# Patient Record
Sex: Female | Born: 1950 | Race: Black or African American | Hispanic: No | Marital: Single | Smoking: Never smoker
Health system: Southern US, Community
[De-identification: ages and names within clinical notes are randomized; demographics above are authoritative.]

## PROBLEM LIST (undated history)

## (undated) DIAGNOSIS — N189 Chronic kidney disease, unspecified: Secondary | ICD-10-CM

## (undated) DIAGNOSIS — E119 Type 2 diabetes mellitus without complications: Secondary | ICD-10-CM

## (undated) DIAGNOSIS — I10 Essential (primary) hypertension: Secondary | ICD-10-CM

## (undated) DIAGNOSIS — D649 Anemia, unspecified: Secondary | ICD-10-CM

## (undated) HISTORY — DX: Chronic kidney disease, unspecified: N18.9

---

## 2000-11-30 ENCOUNTER — Ambulatory Visit (HOSPITAL_COMMUNITY): Admission: RE | Admit: 2000-11-30 | Discharge: 2000-11-30 | Payer: Self-pay | Admitting: *Deleted

## 2000-11-30 ENCOUNTER — Encounter: Payer: Self-pay | Admitting: *Deleted

## 2001-12-10 ENCOUNTER — Encounter: Payer: Self-pay | Admitting: *Deleted

## 2001-12-10 ENCOUNTER — Ambulatory Visit (HOSPITAL_COMMUNITY): Admission: RE | Admit: 2001-12-10 | Discharge: 2001-12-10 | Payer: Self-pay | Admitting: *Deleted

## 2004-01-29 ENCOUNTER — Ambulatory Visit (HOSPITAL_COMMUNITY): Admission: RE | Admit: 2004-01-29 | Discharge: 2004-01-29 | Payer: Self-pay | Admitting: Family Medicine

## 2004-02-22 ENCOUNTER — Ambulatory Visit (HOSPITAL_COMMUNITY): Admission: RE | Admit: 2004-02-22 | Discharge: 2004-02-22 | Payer: Self-pay | Admitting: General Surgery

## 2005-02-10 ENCOUNTER — Ambulatory Visit (HOSPITAL_COMMUNITY): Admission: RE | Admit: 2005-02-10 | Discharge: 2005-02-10 | Payer: Self-pay | Admitting: Family Medicine

## 2005-03-05 ENCOUNTER — Ambulatory Visit (HOSPITAL_COMMUNITY): Admission: RE | Admit: 2005-03-05 | Discharge: 2005-03-05 | Payer: Self-pay | Admitting: Family Medicine

## 2005-11-26 ENCOUNTER — Ambulatory Visit (HOSPITAL_COMMUNITY): Admission: RE | Admit: 2005-11-26 | Discharge: 2005-11-26 | Payer: Self-pay | Admitting: Family Medicine

## 2006-03-02 ENCOUNTER — Ambulatory Visit (HOSPITAL_COMMUNITY): Admission: RE | Admit: 2006-03-02 | Discharge: 2006-03-02 | Payer: Self-pay | Admitting: Family Medicine

## 2006-05-31 ENCOUNTER — Emergency Department (HOSPITAL_COMMUNITY): Admission: EM | Admit: 2006-05-31 | Discharge: 2006-05-31 | Payer: Self-pay | Admitting: Emergency Medicine

## 2008-07-15 ENCOUNTER — Emergency Department (HOSPITAL_COMMUNITY): Admission: EM | Admit: 2008-07-15 | Discharge: 2008-07-15 | Payer: Self-pay | Admitting: Emergency Medicine

## 2010-03-29 ENCOUNTER — Emergency Department (HOSPITAL_COMMUNITY)
Admission: EM | Admit: 2010-03-29 | Discharge: 2010-03-29 | Payer: Self-pay | Source: Home / Self Care | Admitting: Emergency Medicine

## 2010-08-23 NOTE — H&P (Signed)
Stacey Mcneil, Stacey Mcneil               ACCOUNT NO.:  [id number]   MEDICAL RECORD NO.:  [id number]          PATIENT TYPE:  AMB   LOCATION:  DAY                           FACILITY:  [facility]   PHYSICIAN:  Leroy C. Katrinka Blazing, M.D.   DATE OF BIRTH:  [birth year]   DATE OF ADMISSION:  DATE OF DISCHARGE:  LH                                HISTORY & PHYSICAL   HISTORY OF PRESENT ILLNESS:  A 60 year old female with a history of guaiac-  positive stool, routine physical.  She has not had change in bowel habits.  She has two or three bowel movements per day.  She has no constipation or  diarrhea.  There is no history of hemorrhoid disease.  The patient is  scheduled for screening colonoscopy.   PAST MEDICAL HISTORY:  Positive for obesity, gastroesophageal reflux  disease.   MEDICATIONS:  1.  Hydrochlorothiazide.  2.  Prevacid.   SURGICAL HISTORY:  Tubal ligation.   ALLERGIES:  None.   PHYSICAL EXAMINATION:  VITAL SIGNS:  Blood pressure 160/80, pulse 76,  respirations 20, weight 291 pounds.  HEENT:  Unremarkable.  NECK:  Supple without JVD or bruits.  CHEST:  Clear to auscultation.  HEART:  Grade 1 systolic ejection murmur, regular rate and rhythm.  No  gallop, normal S1 and S2.  ABDOMEN:  Soft, nontender, no masses.  RECTAL:  Not repeated.  EXTREMITIES:  No cyanosis, clubbing or edema.  NEUROLOGIC:  No focal, motor, sensory or cerebellar deficits.   IMPRESSION:  1.  Recurrent guaiac positive stools.  2.  Gastroesophageal reflux disease.  3.  Hypertension.   PLAN:  Screening colonoscopy.     Lero   LCS/MEDQ  D:  02/21/2004  T:  02/22/2004  Job:  [id number]   cc:   [facility]  Fax: [phone]   [facility]  [address], [place], [place].

## 2014-05-22 ENCOUNTER — Encounter (HOSPITAL_COMMUNITY): Payer: Self-pay

## 2014-05-22 ENCOUNTER — Emergency Department (HOSPITAL_COMMUNITY)
Admission: EM | Admit: 2014-05-22 | Discharge: 2014-05-23 | Disposition: A | Discharge status: Home / Self Care | Payer: Self-pay | Attending: Emergency Medicine | Admitting: Emergency Medicine

## 2014-05-22 ENCOUNTER — Emergency Department (HOSPITAL_COMMUNITY): Payer: Self-pay

## 2014-05-22 DIAGNOSIS — R000 Tachycardia, unspecified: Secondary | ICD-10-CM | POA: Insufficient documentation

## 2014-05-22 DIAGNOSIS — I10 Essential (primary) hypertension: Secondary | ICD-10-CM | POA: Insufficient documentation

## 2014-05-22 DIAGNOSIS — B349 Viral infection, unspecified: Secondary | ICD-10-CM | POA: Insufficient documentation

## 2014-05-22 DIAGNOSIS — R600 Localized edema: Secondary | ICD-10-CM | POA: Insufficient documentation

## 2014-05-22 DIAGNOSIS — E119 Type 2 diabetes mellitus without complications: Secondary | ICD-10-CM | POA: Insufficient documentation

## 2014-05-22 HISTORY — DX: Essential (primary) hypertension: I10

## 2014-05-22 HISTORY — DX: Type 2 diabetes mellitus without complications: E11.9

## 2014-05-22 LAB — URINE MICROSCOPIC-ADD ON

## 2014-05-22 LAB — CBC WITH DIFFERENTIAL/PLATELET
Basophils Absolute: 0 10*3/uL (ref 0.0–0.1)
Basophils Relative: 0 % (ref 0–1)
Eosinophils Absolute: 0 10*3/uL (ref 0.0–0.7)
Eosinophils Relative: 1 % (ref 0–5)
HCT: 34.2 % — ABNORMAL LOW (ref 36.0–46.0)
Hemoglobin: 10.9 g/dL — ABNORMAL LOW (ref 12.0–15.0)
Lymphocytes Relative: 8 % — ABNORMAL LOW (ref 12–46)
Lymphs Abs: 0.4 10*3/uL — ABNORMAL LOW (ref 0.7–4.0)
MCH: 27.1 pg (ref 26.0–34.0)
MCHC: 31.9 g/dL (ref 30.0–36.0)
MCV: 85.1 fL (ref 78.0–100.0)
Monocytes Absolute: 0.3 10*3/uL (ref 0.1–1.0)
Monocytes Relative: 5 % (ref 3–12)
Neutro Abs: 4.9 10*3/uL (ref 1.7–7.7)
Neutrophils Relative %: 86 % — ABNORMAL HIGH (ref 43–77)
Platelets: 210 10*3/uL (ref 150–400)
RBC: 4.02 MIL/uL (ref 3.87–5.11)
RDW: 14.5 % (ref 11.5–15.5)
WBC: 5.6 10*3/uL (ref 4.0–10.5)

## 2014-05-22 LAB — URINALYSIS, ROUTINE W REFLEX MICROSCOPIC
Bilirubin Urine: NEGATIVE
Glucose, UA: NEGATIVE mg/dL
Ketones, ur: NEGATIVE mg/dL
Leukocytes, UA: NEGATIVE
Nitrite: NEGATIVE
Protein, ur: NEGATIVE mg/dL
Specific Gravity, Urine: 1.02 (ref 1.005–1.030)
Urobilinogen, UA: 0.2 mg/dL (ref 0.0–1.0)
pH: 6 (ref 5.0–8.0)

## 2014-05-22 LAB — COMPREHENSIVE METABOLIC PANEL
ALT: 16 U/L (ref 0–35)
AST: 20 U/L (ref 0–37)
Albumin: 3.5 g/dL (ref 3.5–5.2)
Alkaline Phosphatase: 70 U/L (ref 39–117)
Anion gap: 6 (ref 5–15)
BUN: 15 mg/dL (ref 6–23)
CO2: 28 mmol/L (ref 19–32)
Calcium: 8.8 mg/dL (ref 8.4–10.5)
Chloride: 104 mmol/L (ref 96–112)
Creatinine, Ser: 0.9 mg/dL (ref 0.50–1.10)
GFR calc Af Amer: 78 mL/min — ABNORMAL LOW (ref >90)
GFR calc non Af Amer: 68 mL/min — ABNORMAL LOW (ref >90)
Glucose, Bld: 146 mg/dL — ABNORMAL HIGH (ref 70–99)
Potassium: 3.6 mmol/L (ref 3.5–5.1)
Sodium: 138 mmol/L (ref 135–145)
Total Bilirubin: 0.4 mg/dL (ref 0.3–1.2)
Total Protein: 7.6 g/dL (ref 6.0–8.3)

## 2014-05-22 LAB — LIPASE, BLOOD: Lipase: 17 U/L (ref 11–59)

## 2014-05-22 LAB — BRAIN NATRIURETIC PEPTIDE: B Natriuretic Peptide: 165 pg/mL — ABNORMAL HIGH (ref 0.0–100.0)

## 2014-05-22 LAB — TROPONIN I: Troponin I: <0.03 ng/mL (ref <0.031)

## 2014-05-22 LAB — CBG MONITORING, ED: Glucose-Capillary: 141 mg/dL — ABNORMAL HIGH (ref 70–99)

## 2014-05-22 MED ORDER — SODIUM CHLORIDE 0.9 % IV BOLUS (SEPSIS)
500.0000 mL | Freq: Once | INTRAVENOUS | Status: AC
  Administered 2014-05-23: 500 mL via INTRAVENOUS

## 2014-05-22 MED ORDER — ACETAMINOPHEN 500 MG PO TABS
1000.0000 mg | ORAL_TABLET | Freq: Once | ORAL | Status: AC
  Administered 2014-05-22: 1000 mg via ORAL
  Filled 2014-05-22: qty 2

## 2014-05-22 MED ORDER — PROMETHAZINE HCL 12.5 MG PO TABS
12.5000 mg | ORAL_TABLET | Freq: Once | ORAL | Status: AC
  Administered 2014-05-23: 12.5 mg via ORAL
  Filled 2014-05-22: qty 1

## 2014-05-22 MED ORDER — HYDROCODONE-ACETAMINOPHEN 5-325 MG PO TABS
2.0000 | ORAL_TABLET | Freq: Once | ORAL | Status: AC
  Administered 2014-05-23: 2 via ORAL
  Filled 2014-05-22: qty 2

## 2014-05-22 NOTE — ED Notes (Signed)
Pt reports "hurts all over" states her head hurts, legs hurt, chest discomfort at times, cough.

## 2014-05-22 NOTE — ED Provider Notes (Signed)
CSN: [id number]     Arrival date & time 05/22/14  2219 History   First MD Initiated Contact with Patient 05/22/14 2225     Chief Complaint  Patient presents with  . Generalized Body Aches     (Consider location/radiation/quality/duration/timing/severity/associated sxs/prior Treatment) HPI Comments: Patient is a 64 year old female who presents to the emergency department with a complaint of "I hurt all over". The patient states that this problem started on yesterday with a generally not done feeling well. Today she noted cough, headache, and later pain in her legs, some chest discomfort, and generally not feeling well. This problem continued to progress and the patient came to the emergency department for assistance with this issue. The patient denies any nausea, vomiting, diarrhea. She denies any hemoptysis. There's been no unusual rash noted. His been no neck stiffness present. The patient denies shortness of breath or unusual sweats. There's been no loss of consciousness. The patient is not had any recent changes in medications or diet. She is not sure of being around anyone who has been ill lately. She has tried Tylenol but had only minimal improvement in her discomfort.  The history is provided by the patient.    Past Medical History  Diagnosis Date  . Diabetes mellitus without complication   . Hypertension    History reviewed. No pertinent past surgical history. No family history on file. History  Substance Use Topics  . Smoking status: Never Smoker   . Smokeless tobacco: Not on file  . Alcohol Use: No   OB History    No data available     Review of Systems  Constitutional: Positive for appetite change and fatigue. Negative for activity change.       All ROS Neg except as noted in HPI  HENT: Negative for nosebleeds, sore throat and trouble swallowing.   Eyes: Negative for photophobia and discharge.  Respiratory: Positive for cough. Negative for shortness of breath and  wheezing.   Cardiovascular: Negative for chest pain and palpitations.  Gastrointestinal: Negative for nausea, vomiting, abdominal pain, diarrhea and blood in stool.  Genitourinary: Negative for dysuria, frequency and hematuria.  Musculoskeletal: Positive for myalgias. Negative for back pain, arthralgias and neck pain.  Skin: Negative.   Neurological: Negative for dizziness, seizures and speech difficulty.  Psychiatric/Behavioral: Negative for hallucinations and confusion.      Allergies  Review of patient's allergies indicates no known allergies.  Home Medications   Prior to Admission medications   Not on File   There were no vitals taken for this visit. Physical Exam  Constitutional: She is oriented to person, place, and time. She appears well-developed and well-nourished.  Non-toxic appearance.  HENT:  Head: Normocephalic.  Right Ear: Tympanic membrane and external ear normal.  Left Ear: Tympanic membrane and external ear normal.  Eyes: EOM and lids are normal. Pupils are equal, round, and reactive to light.  Neck: Normal range of motion. Neck supple. Carotid bruit is not present. No tracheal deviation present.  Cardiovascular: Regular rhythm, normal heart sounds, intact distal pulses and normal pulses.  Tachycardia present.  Exam reveals no gallop and no friction rub.   Pulmonary/Chest: Breath sounds normal. No stridor. No respiratory distress.  Abdominal: Soft. Bowel sounds are normal. There is no tenderness. There is no guarding.  Musculoskeletal: Normal range of motion.  1+ edema of the lower extremities. No Hot joints.  Lymphadenopathy:       Head (right side): No submandibular adenopathy present.  Head (left side): No submandibular adenopathy present.    She has no cervical adenopathy.  Neurological: She is alert and oriented to person, place, and time. She has normal strength. No cranial nerve deficit or sensory deficit.  Skin: Skin is warm and dry.   Psychiatric: She has a normal mood and affect. Her speech is normal.  Nursing note and vitals reviewed.   ED Course  Procedures (including critical care time) Labs Review Labs Reviewed  CBC WITH DIFFERENTIAL/PLATELET  COMPREHENSIVE METABOLIC PANEL  LIPASE, BLOOD  BRAIN NATRIURETIC PEPTIDE  TROPONIN I  URINALYSIS, ROUTINE W REFLEX MICROSCOPIC  CBG MONITORING, ED  CBG MONITORING, ED    Imaging Review No results found.   EKG Interpretation   Date/Time:  Monday May 22 2014 22:37:02 EST Ventricular Rate:  113 PR Interval:  138 QRS Duration: 75 QT Interval:  307 QTC Calculation: 421 R Axis:   77 Text Interpretation:  Sinus tachycardia Atrial premature complex  Nonspecific T abnormalities, inferior leads No old tracing to compare  Confirmed by GOLDSTON  MD, SCOTT ([id number]) on 05/22/2014 10:42:23 PM      MDM  Mild temperature elevation noted at 100.7. (Rectal) cbg 141. complete blood count shows a white blood cell count of 5600, hemoglobin slightly low at 10.9, hematocrit low at 34.2, neutrophils elevated at 86. UA negative for infection or kidney stone. CXR neg for pneumonia or other problem.  Pt feeling better after pain meds and fluids. Suspect viral illness. Rx for norco given. Pt to increase fluids. Tylenol every 4 hours for mild pain.   Final diagnoses:  None    *I have reviewed nursing notes, vital signs, and all appropriate lab and imaging results for this patient.[address] PA-C 05/23/14 0103  Dorie Rank, MD 05/23/14 [phone]

## 2014-05-23 MED ORDER — HYDROCODONE-ACETAMINOPHEN 5-325 MG PO TABS: 1.0000 | ORAL_TABLET | ORAL | Status: DC | PRN

## 2014-05-23 NOTE — Discharge Instructions (Signed)
Your lab test and EKG are negative for acute problems. Your chest x-ray is negative for pneumonia or other acute problems. Suspect that she is having a viral illness. Please increase fluids, wash hands frequently, and use Tylenol every 4 hours as needed for mild pain. May use Norco for more severe pain. Norco may cause drowsiness, please use this medication with caution. Viral Infections A virus is a type of germ. Viruses can cause:  Minor sore throats.  Aches and pains.  Headaches.  Runny nose.  Rashes.  Watery eyes.  Tiredness.  Coughs.  Loss of appetite.  Feeling sick to your stomach (nausea).  Throwing up (vomiting).  Watery poop (diarrhea). HOME CARE   Only take medicines as told by your doctor.  Drink enough water and fluids to keep your pee (urine) clear or pale yellow. Sports drinks are a good choice.  Get plenty of rest and eat healthy. Soups and broths with crackers or rice are fine. GET HELP RIGHT AWAY IF:   You have a very bad headache.  You have shortness of breath.  You have chest pain or neck pain.  You have an unusual rash.  You cannot stop throwing up.  You have watery poop that does not stop.  You cannot keep fluids down.  You or your child has a temperature by mouth above 102 F (38.9 C), not controlled by medicine.  Your baby is older than 3 months with a rectal temperature of 102 F (38.9 C) or higher.  Your baby is 21 months old or younger with a rectal temperature of 100.4 F (38 C) or higher. MAKE SURE YOU:   Understand these instructions.  Will watch this condition.  Will get help right away if you are not doing well or get worse. Document Released: 03/06/2008 Document Revised: 06/16/2011 Document Reviewed: 07/30/2010 [facility] Patient Information 2015 [place], [place]. This information is not intended to replace advice given to you by your health care provider. Make sure you discuss any questions you have with your health care  provider.

## 2015-09-18 ENCOUNTER — Telehealth: Payer: Self-pay

## 2015-09-18 NOTE — Telephone Encounter (Signed)
[phone]  OR [phone]  PATIENT CALLED TO SCHEDULE SCREENING TCS

## 2015-09-18 NOTE — Telephone Encounter (Signed)
Pt called back on same day shortly after ( 10:16) and said she did not want to schedule now). See separate note.

## 2015-09-18 NOTE — Telephone Encounter (Signed)
Pt called to say to cancel scheduling procedure.

## 2015-09-18 NOTE — Telephone Encounter (Signed)
Noted  

## 2016-01-06 HISTORY — PX: COLONOSCOPY: SHX174

## 2016-02-06 HISTORY — PX: COLONOSCOPY: SHX174

## 2016-02-26 ENCOUNTER — Ambulatory Visit (HOSPITAL_COMMUNITY): Payer: BLUE CROSS/BLUE SHIELD | Attending: Orthopedic Surgery | Admitting: Physical Therapy

## 2016-02-26 ENCOUNTER — Encounter (HOSPITAL_COMMUNITY): Payer: Self-pay | Admitting: Physical Therapy

## 2016-02-26 DIAGNOSIS — M25661 Stiffness of right knee, not elsewhere classified: Secondary | ICD-10-CM | POA: Diagnosis present

## 2016-02-26 DIAGNOSIS — M6281 Muscle weakness (generalized): Secondary | ICD-10-CM | POA: Diagnosis present

## 2016-02-26 DIAGNOSIS — R2689 Other abnormalities of gait and mobility: Secondary | ICD-10-CM | POA: Insufficient documentation

## 2016-02-26 DIAGNOSIS — M25561 Pain in right knee: Secondary | ICD-10-CM | POA: Insufficient documentation

## 2016-02-26 NOTE — Therapy (Signed)
[place] [place], [place], [address] Phone: [phone]   Fax:  [phone]  Physical Therapy Evaluation  Patient Details  Name: Stacey Mcneil MRN: [id number] Date of Birth: [birth year] Referring Provider: Nicholes Stairs, MD  Encounter Date: 02/26/2016      PT End of Session - 02/26/16 1253    Visit Number 1   Number of Visits 12   Date for PT Re-Evaluation 03/18/16   Authorization Type [employer]   Authorization Time Period 02/26/16 to 04/08/16   PT Start Time 1033   PT Stop Time 1115   PT Time Calculation (min) 42 min   Activity Tolerance No increased pain;Patient tolerated treatment well   Behavior During Therapy [facility] for tasks assessed/performed      Past Medical History:  Diagnosis Date  . Diabetes mellitus without complication ([place])   . Hypertension     History reviewed. No pertinent surgical history.  There were no vitals filed for this visit.       Subjective Assessment - 02/26/16 1043    Subjective Pt reports Rt knee pain since the last weekend in July. She was on her knees dying her hair without cushion and noticed her knee was bothering her after. Then again in [place] and couldn't walk without limping. She had to use a cane to get to her terminal. Pain has worsened since then and she is having trouble finding comfort. She does not want to have surgery and is coming here to improve her knee strength and decrease pain.    Pertinent History HTN, DM   Limitations Walking;Standing   How long can you sit comfortably? unsure    How long can you stand comfortably? unsure   How long can you walk comfortably? unsure   Diagnostic tests Xrays: osteoarthritis   Patient Stated Goals decrease pain and improve strength   Currently in Pain? Other (Comment)  None currently, but usually has pain deep in the knee ant/post. Max: 9/10             [facility] PT Assessment - 02/26/16  0001      Assessment   Medical Diagnosis Rt knee OA   Referring Provider Nicholes Stairs, MD   Onset Date/Surgical Date 10/26/15  approx   Next MD Visit sometime in December   Prior Therapy none      Precautions   Precautions None     Balance Screen   Has the patient fallen in the past 6 months No   Has the patient had a decrease in activity level because of a fear of falling?  No   Is the patient reluctant to leave their home because of a fear of falling?  No     Home [employer] residence   Living Arrangements Other relatives  grandson    Additional Comments 1 flight of stairs      Prior Function   Level of Independence Independent   Leisure spending time with her grandkids, running errands      Cognition   Overall Cognitive Status Within Functional Limits for tasks assessed     Observation/Other Assessments   Focus on Therapeutic Outcomes (FOTO)  67% limited      Sensation   Light Touch Appears Intact     ROM / Strength   AROM / PROM / Strength AROM;Strength     AROM   AROM Assessment Site Knee  Right/Left Knee Right;Left   Right Knee Flexion 97   Left Knee Flexion 110     Strength   Strength Assessment Site Hip;Knee;Ankle   Right/Left Hip Right;Left   Right Hip Flexion 4-/5   Right Hip Extension 4-/5   Right Hip ABduction 3+/5   Left Hip Flexion 4+/5   Left Hip Extension 4-/5   Left Hip ABduction 4-/5   Right/Left Knee Right;Left   Right Knee Flexion 4-/5   Right Knee Extension 5/5   Left Knee Flexion 4-/5   Left Knee Extension 5/5   Right/Left Ankle Right;Left   Right Ankle Dorsiflexion 5/5   Left Ankle Dorsiflexion 5/5     Palpation   Patella mobility WNL   Palpation comment TTP along Rt medial hamstring/adductors, medial jt line tenderness     Transfers   Five time sit to stand comments  15.8 sec, UE on thighs      Ambulation/Gait   Pre-Gait Activities Ascend 6 " steps with B handrails and reciprocal  pattern, Descend 6" steps with B handrails and Rt step to pattern.   Gait Comments Rt knee flexion in midstance, decreased weight shift Rt      Standardized Balance Assessment   Standardized Balance Assessment Timed Up and Go Test     Timed Up and Go Test   TUG Comments 13.5 sec, UE on thighs                    [facility] Adult PT Treatment/Exercise - 02/26/16 0001      Exercises   Exercises Knee/Hip     Knee/Hip Exercises: Supine   Bridges Both;1 set;5 reps   Bridges Limitations HEP demo     Knee/Hip Exercises: Sidelying   Clams x5 reps each with red TB for [employer]                 PT Education - 02/26/16 1251    Education provided Yes   Education Details eval findings/POC; benefits of PT to improve strength/mobility/pain in individuals with knee OA; recommended PT POC and frequency; HEP   Person(s) Educated Patient   Methods Explanation;Other (comment);Demonstration  unable to provide handout due to printer being broken    Comprehension Verbalized understanding;Returned demonstration          PT Short Term Goals - 02/26/16 1306      PT SHORT TERM GOAL #1   Title Pt will demo consistency and independence with her HEP to improve knee ROM and strength.    Time 2   Period Weeks   Status New     PT SHORT TERM GOAL #2   Title Pt will demo Rt knee flexion ROM to atleast 105 deg, to improve her technique with sit to stand activity.    Time 3   Period Weeks   Status New     PT SHORT TERM GOAL #3   Title Pt will demo equal weight shift with sit to stand activity throughout her session, without cues from therapist, to decrease wear and strain on her LLE.   Time 3   Period Weeks   Status New           PT Long Term Goals - 02/26/16 1308      PT LONG TERM GOAL #1   Title pt will demo imroved BLE strength to atleast 4+/5 MMT, to improve her stability and safety with functional activity at home.    Time 6   Period Weeks  Status New     PT LONG  TERM GOAL #2   Title Pt will ascend/descend atleast 6 steps without handrails and with reciprocal pattern, to allow her to go downstairs to do her laundry throughout the day.   Time 6   Period Weeks   Status New     PT LONG TERM GOAL #3   Title Pt will demo improved functional strength evident by her ability to perform 5x sit to stand in less than 11 sec, without weight shift Lt and without use of UE support.    Time 6   Period Weeks   Status New     PT LONG TERM GOAL #4   Title Pt will perform TUG in less than 10 sec, to indicate she is at a decreased risk of falling out in the community.   Time 6   Period Weeks   Status New     PT LONG TERM GOAL #5   Title Pt will report no greater than 5/10 max pain with daily activity, to improve her QOL and decrease risk of knee replacement surgery at discharge.   Time 6   Period Weeks   Status New               Plan - 02/26/16 1254    Clinical Impression Statement Pt is a 65yo F referred to OPP[facility] for management of Rt knee pain and weakness that is limiting her overall functional mobility. She demonstrates limited Rt knee flexion ROM, hip weakness, and muscle spasm of the adductors and hamstrings with palpation. Noting increased time to complete 5x sit to stand and increased assistance needed with stair negotiation primarily secondary hip extensor weakness. At this time she does not want to have knee replacement surgery and would benefit from skilled PT to address her limitations and improve activity tolerance and independence with her ADLs at home. I discussed this with the pt and implemented her HEP with all questions answered. She is in agreement with proposed PT POC and frequency.   Rehab Potential Good   PT Frequency 2x / week   PT Duration Other (comment)  decrease to 1x/week after first 2 weeks and pt is comfortable with updated HEP   PT Treatment/Interventions ADLs/Self Care Home Management;Moist Heat;Cryotherapy;Gait training;Stair  training;Functional mobility training;Therapeutic activities;Therapeutic exercise;Balance training;Neuromuscular re-education;Passive range of motion;Manual techniques;Dry needling   PT Next Visit Plan try to stick with gravity eliminated positions for strengthening, adding standing strength as tolerated to avoid increase wear on knees. hip flexibility (quad/hip flexor)   PT Home Exercise Plan supine bridge, sidelying clamshells with red TB    Consulted and Agree with Plan of Care Patient      Patient will benefit from skilled therapeutic intervention in order to improve the following deficits and impairments:  Abnormal gait, Impaired flexibility, Postural dysfunction, Improper body mechanics, Decreased range of motion  Visit Diagnosis: Right knee pain, unspecified chronicity  Stiffness of right knee, not elsewhere classified  Other abnormalities of gait and mobility  Muscle weakness (generalized)     Problem List There are no active problems to display for this patient.   1:18 PM,02/26/16 Elly Modena PT, DPT [facility] [place], [place], [address] Phone: [phone]   Fax:  [phone]  Name: Stacey Mcneil MRN: [id number] Date of Birth: [birth year]

## 2016-03-12 ENCOUNTER — Ambulatory Visit (HOSPITAL_COMMUNITY): Payer: BLUE CROSS/BLUE SHIELD | Attending: Orthopedic Surgery | Admitting: Physical Therapy

## 2016-03-12 DIAGNOSIS — M25561 Pain in right knee: Secondary | ICD-10-CM

## 2016-03-12 DIAGNOSIS — R2689 Other abnormalities of gait and mobility: Secondary | ICD-10-CM | POA: Diagnosis present

## 2016-03-12 DIAGNOSIS — M6281 Muscle weakness (generalized): Secondary | ICD-10-CM

## 2016-03-12 DIAGNOSIS — M25661 Stiffness of right knee, not elsewhere classified: Secondary | ICD-10-CM | POA: Insufficient documentation

## 2016-03-12 NOTE — Therapy (Signed)
[place], [place], [address] Phone: [phone]   Fax:  [phone]  Physical Therapy Treatment  Patient Details  Name: Stacey Mcneil MRN: [id number] Date of Birth: 01/09/1951 Referring Provider: Nicholes Stairs, MD  Encounter Date: 03/12/2016      PT End of Session - 03/12/16 1110    Visit Number 2   Number of Visits 12   Date for PT Re-Evaluation 03/18/16   Authorization Type BCBS   Authorization Time Period 02/26/16 to 04/08/16   PT Start Time 1032   PT Stop Time 1111   PT Time Calculation (min) 39 min   Activity Tolerance No increased pain;Patient tolerated treatment well   Behavior During Therapy [facility] for tasks assessed/performed      Past Medical History:  Diagnosis Date  . Diabetes mellitus without complication ([place])   . Hypertension     No past surgical history on file.  There were no vitals filed for this visit.      Subjective Assessment - 03/12/16 1039    Subjective Pt reports things are going well. She has no pain currently and has been doing her exercises at home.    Pertinent History HTN, DM   Limitations Walking;Standing   How long can you sit comfortably? unsure    How long can you stand comfortably? unsure   How long can you walk comfortably? unsure   Diagnostic tests Xrays: osteoarthritis   Patient Stated Goals decrease pain and improve strength                         [facility] Adult PT Treatment/Exercise - 03/12/16 0001      Knee/Hip Exercises: Stretches   Hip Flexor Stretch Both;3 reps;30 seconds   Hip Flexor Stretch Limitations supine    Knee: Self-Stretch to increase Flexion Left;5 reps;10 seconds   Knee: Self-Stretch Limitations 12" step    Gastroc Stretch Both;3 reps;30 seconds   Gastroc Stretch Limitations slantboard      Knee/Hip Exercises: Seated   Long Arc Quad Both;1 set;15 reps;Weights   Long Arc Quad Weight 5 lbs.     Knee/Hip Exercises:  Supine   Bridges 2 sets;10 reps   Bridges Limitations green TB around knees      Knee/Hip Exercises: Sidelying   Clams 2x15 reps with green TB     Knee/Hip Exercises: Prone   Hamstring Curl 1 set;15 reps   Hamstring Curl Limitations green TB   Straight Leg Raises 2 sets;10 reps;Both                PT Education - 03/12/16 1100    Education provided Yes   Education Details discussed technique with therex and goals    Person(s) Educated Patient   Methods Explanation   Comprehension Verbalized understanding          PT Short Term Goals - 02/26/16 1306      PT SHORT TERM GOAL #1   Title Pt will demo consistency and independence with her HEP to improve knee ROM and strength.    Time 2   Period Weeks   Status New     PT SHORT TERM GOAL #2   Title Pt will demo Rt knee flexion ROM to atleast 105 deg, to improve her technique with sit to stand activity.    Time 3   Period Weeks   Status New     PT SHORT TERM GOAL #3  Title Pt will demo equal weight shift with sit to stand activity throughout her session, without cues from therapist, to decrease wear and strain on her LLE.   Time 3   Period Weeks   Status New           PT Long Term Goals - 02/26/16 1308      PT LONG TERM GOAL #1   Title pt will demo imroved BLE strength to atleast 4+/5 MMT, to improve her stability and safety with functional activity at home.    Time 6   Period Weeks   Status New     PT LONG TERM GOAL #2   Title Pt will ascend/descend atleast 6 steps without handrails and with reciprocal pattern, to allow her to go downstairs to do her laundry throughout the day.   Time 6   Period Weeks   Status New     PT LONG TERM GOAL #3   Title Pt will demo improved functional strength evident by her ability to perform 5x sit to stand in less than 11 sec, without weight shift Lt and without use of UE support.    Time 6   Period Weeks   Status New     PT LONG TERM GOAL #4   Title Pt will perform  TUG in less than 10 sec, to indicate she is at a decreased risk of falling out in the community.   Time 6   Period Weeks   Status New     PT LONG TERM GOAL #5   Title Pt will report no greater than 5/10 max pain with daily activity, to improve her QOL and decrease risk of knee replacement surgery at discharge.   Time 6   Period Weeks   Status New               Plan - 03/12/16 1202    Clinical Impression Statement Today's session was focused on LE strengthening and flexibility exercise to improve activity tolerance and mobility. Pt with noted fatigue during several exercises and requiring verbal cues to slow down occasionally. Also discussed goals of PT with pt verbalizing understanding and agreement. She ended the session without report of Rt knee pain.   Rehab Potential Good   PT Frequency 2x / week   PT Duration Other (comment)  decrease to 1x/week after first 2 weeks and pt is comfortable with updated HEP   PT Treatment/Interventions ADLs/Self Care Home Management;Moist Heat;Cryotherapy;Gait training;Stair training;Functional mobility training;Therapeutic activities;Therapeutic exercise;Balance training;Neuromuscular re-education;Passive range of motion;Manual techniques;Dry needling   PT Next Visit Plan try to stick with gravity eliminated positions for LE strengthening, adding standing strength as tolerated to avoid increase wear on knees. hip flexibility (quad/hip flexor)   PT Home Exercise Plan supine bridge, sidelying clamshells with red TB    Consulted and Agree with Plan of Care Patient      Patient will benefit from skilled therapeutic intervention in order to improve the following deficits and impairments:  Abnormal gait, Impaired flexibility, Postural dysfunction, Improper body mechanics, Decreased range of motion  Visit Diagnosis: Right knee pain, unspecified chronicity  Stiffness of right knee, not elsewhere classified  Other abnormalities of gait and  mobility  Muscle weakness (generalized)     Problem List There are no active problems to display for this patient.   12:07 PM,03/12/16 Elly Modena PT, DPT [facility] [place], [place], [address] Phone:  [phone]   Fax:  [phone]  Name: Stacey Mcneil MRN: [id number] Date of Birth: [birth year]

## 2016-03-14 ENCOUNTER — Ambulatory Visit (HOSPITAL_COMMUNITY): Payer: BLUE CROSS/BLUE SHIELD | Admitting: Physical Therapy

## 2016-03-14 DIAGNOSIS — M25561 Pain in right knee: Secondary | ICD-10-CM

## 2016-03-14 DIAGNOSIS — R2689 Other abnormalities of gait and mobility: Secondary | ICD-10-CM

## 2016-03-14 DIAGNOSIS — M6281 Muscle weakness (generalized): Secondary | ICD-10-CM

## 2016-03-14 DIAGNOSIS — M25661 Stiffness of right knee, not elsewhere classified: Secondary | ICD-10-CM

## 2016-03-14 NOTE — Therapy (Signed)
[place], [place], [address] Phone: [phone]   Fax:  [phone]  Physical Therapy Treatment  Patient Details  Name: Stacey Mcneil MRN: [id number] Date of Birth: 09-Jan-1951 Referring Provider: Nicholes Stairs, MD  Encounter Date: 03/14/2016      PT End of Session - 03/14/16 1259    Visit Number 3   Number of Visits 12   Date for PT Re-Evaluation 03/18/16   Authorization Type BCBS   Authorization Time Period 02/26/16 to 04/08/16   PT Start Time 1032   PT Stop Time 1112   PT Time Calculation (min) 40 min   Activity Tolerance No increased pain;Patient tolerated treatment well   Behavior During Therapy [facility] for tasks assessed/performed      Past Medical History:  Diagnosis Date  . Diabetes mellitus without complication ([place])   . Hypertension     No past surgical history on file.  There were no vitals filed for this visit.      Subjective Assessment - 03/14/16 1038    Subjective Pt reports that her back is bothering her right now. It seemed to come out of nowhere. Her knees are good currently.    Pertinent History HTN, DM   Limitations Walking;Standing   How long can you sit comfortably? unsure    How long can you stand comfortably? unsure   How long can you walk comfortably? unsure   Diagnostic tests Xrays: osteoarthritis   Patient Stated Goals decrease pain and improve strength   Currently in Pain? Other (Comment)  No, back pain only                          [facility] Adult PT Treatment/Exercise - 03/14/16 0001      Knee/Hip Exercises: Standing   Heel Raises Both;1 set;20 reps   Heel Raises Limitations toe raises      Knee/Hip Exercises: Seated   Other Seated Knee/Hip Exercises trunk rotation stretch 5x10 sec each      Knee/Hip Exercises: Supine   Bridges with Ball Squeeze 2 sets;10 reps   Straight Leg Raises 2 sets;10 reps;Both     Knee/Hip Exercises: Sidelying    Hip ABduction Both;3 sets;5 reps     Knee/Hip Exercises: Prone   Hamstring Curl 2 sets;10 reps   Hamstring Curl Limitations blue TB                PT Education - 03/14/16 1256    Education provided Yes   Education Details importance of adhering to HEP provided by the therapist to address specific areas of impairment.   Person(s) Educated Patient   Methods Explanation   Comprehension Verbalized understanding          PT Short Term Goals - 02/26/16 1306      PT SHORT TERM GOAL #1   Title Pt will demo consistency and independence with her HEP to improve knee ROM and strength.    Time 2   Period Weeks   Status New     PT SHORT TERM GOAL #2   Title Pt will demo Rt knee flexion ROM to atleast 105 deg, to improve her technique with sit to stand activity.    Time 3   Period Weeks   Status New     PT SHORT TERM GOAL #3   Title Pt will demo equal weight shift with sit to stand activity throughout her session, without cues  from therapist, to decrease wear and strain on her LLE.   Time 3   Period Weeks   Status New           PT Long Term Goals - 02/26/16 1308      PT LONG TERM GOAL #1   Title pt will demo imroved BLE strength to atleast 4+/5 MMT, to improve her stability and safety with functional activity at home.    Time 6   Period Weeks   Status New     PT LONG TERM GOAL #2   Title Pt will ascend/descend atleast 6 steps without handrails and with reciprocal pattern, to allow her to go downstairs to do her laundry throughout the day.   Time 6   Period Weeks   Status New     PT LONG TERM GOAL #3   Title Pt will demo improved functional strength evident by her ability to perform 5x sit to stand in less than 11 sec, without weight shift Lt and without use of UE support.    Time 6   Period Weeks   Status New     PT LONG TERM GOAL #4   Title Pt will perform TUG in less than 10 sec, to indicate she is at a decreased risk of falling out in the community.    Time 6   Period Weeks   Status New     PT LONG TERM GOAL #5   Title Pt will report no greater than 5/10 max pain with daily activity, to improve her QOL and decrease risk of knee replacement surgery at discharge.   Time 6   Period Weeks   Status New               Plan - 03/14/16 1300    Clinical Impression Statement Continued today with therex to improve hip/knee strength. Pt had increased difficulty with hip abduction exercise, requiring several rest breaks to complete activity secondary to significant weakness. Ended session explaining the importance of HEP adherence regularly to improve areas of deficit, she verbalized understanding.   Rehab Potential Good   PT Frequency 2x / week   PT Duration Other (comment)  decrease to 1x/week after first 2 weeks and pt is comfortable with updated HEP   PT Treatment/Interventions ADLs/Self Care Home Management;Moist Heat;Cryotherapy;Gait training;Stair training;Functional mobility training;Therapeutic activities;Therapeutic exercise;Balance training;Neuromuscular re-education;Passive range of motion;Manual techniques;Dry needling   PT Next Visit Plan try to stick with gravity eliminated positions for LE strengthening, adding standing strength as tolerated to avoid increase wear on knees. hip flexibility (quad/hip flexor)   PT Home Exercise Plan supine bridge, sidelying clamshells with red TB    Consulted and Agree with Plan of Care Patient      Patient will benefit from skilled therapeutic intervention in order to improve the following deficits and impairments:  Abnormal gait, Impaired flexibility, Postural dysfunction, Improper body mechanics, Decreased range of motion  Visit Diagnosis: Right knee pain, unspecified chronicity  Stiffness of right knee, not elsewhere classified  Other abnormalities of gait and mobility  Muscle weakness (generalized)     Problem List There are no active problems to display for this  patient.   1:10 PM,03/14/16 [place], DPT [facility] [place] [address] [place], [place], [address] Phone: [phone]   Fax:  [phone]  Name: LEXXIS RAUL MRN: [id number] Date of Birth: 07-Dec-1950

## 2016-03-18 ENCOUNTER — Ambulatory Visit (HOSPITAL_COMMUNITY): Payer: BLUE CROSS/BLUE SHIELD | Admitting: Physical Therapy

## 2016-03-18 DIAGNOSIS — M25661 Stiffness of right knee, not elsewhere classified: Secondary | ICD-10-CM

## 2016-03-18 DIAGNOSIS — M6281 Muscle weakness (generalized): Secondary | ICD-10-CM

## 2016-03-18 DIAGNOSIS — R2689 Other abnormalities of gait and mobility: Secondary | ICD-10-CM

## 2016-03-18 DIAGNOSIS — M25561 Pain in right knee: Secondary | ICD-10-CM

## 2016-03-18 NOTE — Therapy (Signed)
[place], [place], [address] Phone: [phone]   Fax:  [phone]  Physical Therapy Treatment  Patient Details  Name: Stacey Mcneil MRN: [id number] Date of Birth: 1950/11/07 Referring Provider: Nicholes Stairs, MD  Encounter Date: 03/18/2016      PT End of Session - 03/18/16 1115    Visit Number 4   Number of Visits 12   Date for PT Re-Evaluation 03/18/16   Authorization Type BCBS   Authorization Time Period 02/26/16 to 04/08/16   PT Start Time 1039   PT Stop Time 1118   PT Time Calculation (min) 39 min   Activity Tolerance No increased pain;Patient tolerated treatment well   Behavior During Therapy [facility] for tasks assessed/performed      Past Medical History:  Diagnosis Date  . Diabetes mellitus without complication ([place])   . Hypertension     No past surgical history on file.  There were no vitals filed for this visit.      Subjective Assessment - 03/18/16 1120    Subjective Pt states she's doing really well today.  No pain in back or knees.   Currently in Pain? No/denies                         [facility] Adult PT Treatment/Exercise - 03/18/16 0001      Knee/Hip Exercises: Stretches   Hip Flexor Stretch Both;3 reps;20 seconds   Hip Flexor Stretch Limitations standing on 8" box   Gastroc Stretch Both;3 reps;30 seconds   Gastroc Stretch Limitations slantboard      Knee/Hip Exercises: Standing   Heel Raises Both;1 set;20 reps   Heel Raises Limitations toeraises 20 reps    Hip ADduction Both;10 reps   Hip Abduction Both;10 reps   SLS with Vectors 5X3" each with 1 HHA     Knee/Hip Exercises: Supine   Bridges 15 reps   Straight Leg Raises 15 reps     Knee/Hip Exercises: Sidelying   Hip ABduction Both;15 reps     Knee/Hip Exercises: Prone   Hamstring Curl 10 reps   Hamstring Curl Limitations slow, controlled without hip movement   Straight Leg Raises 2 sets;10  reps;Both                  PT Short Term Goals - 02/26/16 1306      PT SHORT TERM GOAL #1   Title Pt will demo consistency and independence with her HEP to improve knee ROM and strength.    Time 2   Period Weeks   Status New     PT SHORT TERM GOAL #2   Title Pt will demo Rt knee flexion ROM to atleast 105 deg, to improve her technique with sit to stand activity.    Time 3   Period Weeks   Status New     PT SHORT TERM GOAL #3   Title Pt will demo equal weight shift with sit to stand activity throughout her session, without cues from therapist, to decrease wear and strain on her LLE.   Time 3   Period Weeks   Status New           PT Long Term Goals - 02/26/16 1308      PT LONG TERM GOAL #1   Title pt will demo imroved BLE strength to atleast 4+/5 MMT, to improve her stability and safety with functional activity at home.  Time 6   Period Weeks   Status New     PT LONG TERM GOAL #2   Title Pt will ascend/descend atleast 6 steps without handrails and with reciprocal pattern, to allow her to go downstairs to do her laundry throughout the day.   Time 6   Period Weeks   Status New     PT LONG TERM GOAL #3   Title Pt will demo improved functional strength evident by her ability to perform 5x sit to stand in less than 11 sec, without weight shift Lt and without use of UE support.    Time 6   Period Weeks   Status New     PT LONG TERM GOAL #4   Title Pt will perform TUG in less than 10 sec, to indicate she is at a decreased risk of falling out in the community.   Time 6   Period Weeks   Status New     PT LONG TERM GOAL #5   Title Pt will report no greater than 5/10 max pain with daily activity, to improve her QOL and decrease risk of knee replacement surgery at discharge.   Time 6   Period Weeks   Status New               Plan - 03/18/16 1116    Clinical Impression Statement continued with focus on hip/knee strength.  Progressed to standing hip  extension/abduction to give alternative way to complete and in gravity elimated postition.  cues needed for form and decreasing substitution.  Also added vector stance to work on weak glute muscles.  Pt able to complete 15 reps of most exercises today wtihout rest, except to supine SLR.  VC's also needed for this one not to hold breath. Pt is progressing well.  Remained painfree at end of session.   Rehab Potential Good   PT Frequency 2x / week   PT Duration Other (comment)  decrease to 1x/week after first 2 weeks and pt is comfortable with updated HEP   PT Treatment/Interventions ADLs/Self Care Home Management;Moist Heat;Cryotherapy;Gait training;Stair training;Functional mobility training;Therapeutic activities;Therapeutic exercise;Balance training;Neuromuscular re-education;Passive range of motion;Manual techniques;Dry needling   PT Next Visit Plan try to stick with gravity eliminated positions for LE strengthening, adding standing strength as tolerated to avoid increase wear on knees. hip flexibility (quad/hip flexor)   PT Home Exercise Plan supine bridge, sidelying clamshells with red TB    Consulted and Agree with Plan of Care Patient      Patient will benefit from skilled therapeutic intervention in order to improve the following deficits and impairments:  Abnormal gait, Impaired flexibility, Postural dysfunction, Improper body mechanics, Decreased range of motion  Visit Diagnosis: Right knee pain, unspecified chronicity  Stiffness of right knee, not elsewhere classified  Other abnormalities of gait and mobility  Muscle weakness (generalized)     Problem List There are no active problems to display for this patient.   Teena Irani, PTA/CLT [phone]  03/18/2016, 11:20 AM  [place] [address] [place], [place], [address] Phone: [phone]   Fax:  [phone]  Name: Stacey Mcneil MRN: [id number] Date of Birth:  1951/02/03

## 2016-03-21 ENCOUNTER — Ambulatory Visit (HOSPITAL_COMMUNITY): Payer: BLUE CROSS/BLUE SHIELD

## 2016-03-21 DIAGNOSIS — R2689 Other abnormalities of gait and mobility: Secondary | ICD-10-CM

## 2016-03-21 DIAGNOSIS — M6281 Muscle weakness (generalized): Secondary | ICD-10-CM

## 2016-03-21 DIAGNOSIS — M25661 Stiffness of right knee, not elsewhere classified: Secondary | ICD-10-CM

## 2016-03-21 DIAGNOSIS — M25561 Pain in right knee: Secondary | ICD-10-CM | POA: Diagnosis not present

## 2016-03-21 NOTE — Patient Instructions (Signed)
Bridge    Lie back, legs bent. Inhale, pressing hips up. Keeping ribs in, lengthen lower back. Exhale, rolling down along spine from top. Repeat 15 times. Do 2 sessions per day.  http://pm.exer.us/55   Copyright  VHI. All rights reserved.   Abduction: Clam (Eccentric) - Side-Lying    Lie on side with knees bent. Lift top knee, keeping feet together. Keep trunk steady. Slowly lower for 3-5 seconds. 15 reps per set, 2 sets per day, 4 days per week. Bring red theraband around both knees for strengthening  http://ecce.exer.us/65   Copyright  VHI. All rights reserved.   Abduction    Laying on your side with hips stacked.  Lift top leg up toward ceiling. Return.  Repeat 15 times each leg. Do 1-2 sessions per day.  http://gt2.exer.us/386   Copyright  VHI. All rights reserved.

## 2016-03-21 NOTE — Therapy (Signed)
[place], [place], [address] Phone: [phone]   Fax:  [phone]  Physical Therapy Treatment  Patient Details  Name: Stacey Mcneil MRN: [id number] Date of Birth: 06/21/1950 Referring Provider: Nicholes Stairs, MD  Encounter Date: 03/21/2016      PT End of Session - 03/21/16 1141    Visit Number 5   Number of Visits 12   Date for PT Re-Evaluation 03/18/16   Authorization Type BCBS   Authorization Time Period 02/26/16 to 04/08/16   PT Start Time 1130  pt late for session   PT Stop Time 1205   PT Time Calculation (min) 35 min   Activity Tolerance No increased pain;Patient tolerated treatment well   Behavior During Therapy [facility] for tasks assessed/performed      Past Medical History:  Diagnosis Date  . Diabetes mellitus without complication ([place])   . Hypertension     No past surgical history on file.  There were no vitals filed for this visit.      Subjective Assessment - 03/21/16 1126    Subjective Pt stated she is feeling good today, no reports of pain in knees.     Pertinent History HTN, DM   Patient Stated Goals decrease pain and improve strength   Currently in Pain? No/denies             [facility] Adult PT Treatment/Exercise - 03/21/16 0001      Knee/Hip Exercises: Stretches   Active Hamstring Stretch Both;2 reps;30 seconds   Active Hamstring Stretch Limitations 12in step     Knee/Hip Exercises: Standing   Heel Raises Both;1 set;20 reps   Heel Raises Limitations toeraises 20 reps    Hip Abduction Both;15 reps   Abduction Limitations cueing for form     Knee/Hip Exercises: Supine   Bridges 15 reps   Bridges Limitations green TB around knees    Straight Leg Raises 15 reps     Knee/Hip Exercises: Sidelying   Hip ABduction Both;15 reps     Knee/Hip Exercises: Prone   Hamstring Curl 15 reps   Hamstring Curl Limitations RTB cueing for slow, controlled without hip movements    Straight Leg Raises 15 reps                  PT Short Term Goals - 02/26/16 1306      PT SHORT TERM GOAL #1   Title Pt will demo consistency and independence with her HEP to improve knee ROM and strength.    Time 2   Period Weeks   Status New     PT SHORT TERM GOAL #2   Title Pt will demo Rt knee flexion ROM to atleast 105 deg, to improve her technique with sit to stand activity.    Time 3   Period Weeks   Status New     PT SHORT TERM GOAL #3   Title Pt will demo equal weight shift with sit to stand activity throughout her session, without cues from therapist, to decrease wear and strain on her LLE.   Time 3   Period Weeks   Status New           PT Long Term Goals - 02/26/16 1308      PT LONG TERM GOAL #1   Title pt will demo imroved BLE strength to atleast 4+/5 MMT, to improve her stability and safety with functional activity at home.    Time 6  Period Weeks   Status New     PT LONG TERM GOAL #2   Title Pt will ascend/descend atleast 6 steps without handrails and with reciprocal pattern, to allow her to go downstairs to do her laundry throughout the day.   Time 6   Period Weeks   Status New     PT LONG TERM GOAL #3   Title Pt will demo improved functional strength evident by her ability to perform 5x sit to stand in less than 11 sec, without weight shift Lt and without use of UE support.    Time 6   Period Weeks   Status New     PT LONG TERM GOAL #4   Title Pt will perform TUG in less than 10 sec, to indicate she is at a decreased risk of falling out in the community.   Time 6   Period Weeks   Status New     PT LONG TERM GOAL #5   Title Pt will report no greater than 5/10 max pain with daily activity, to improve her QOL and decrease risk of knee replacement surgery at discharge.   Time 6   Period Weeks   Status New               Plan - 03/21/16 1141    Clinical Impression Statement Reviewed compliance with HEP, pt stated she has  completed some exercises we do in session but does not have printout.  Pt given additional printout including initial eval HEP.  Reviewed goals and importance of adherence with HEP for maximal benefits towards goals.  Session foucs on hip/knee strengthening.  Therapist facilitaiton to assure correct form and to improve control with movements for maximal benefits.  No reports of pain through session, was limited by fatigue with actvity.     Rehab Potential Good   PT Frequency 2x / week   PT Duration --  decrease to 1x/week following 2 weeks and pt. comfortable wiht HEP   PT Treatment/Interventions ADLs/Self Care Home Management;Moist Heat;Cryotherapy;Gait training;Stair training;Functional mobility training;Therapeutic activities;Therapeutic exercise;Balance training;Neuromuscular re-education;Passive range of motion;Manual techniques;Dry needling   PT Next Visit Plan try to stick with gravity eliminated positions for LE strengthening, adding standing strength as tolerated to avoid increase wear on knees. hip flexibility (quad/hip flexor)   PT Home Exercise Plan supine bridge, sidelying clamshells with red TB       Patient will benefit from skilled therapeutic intervention in order to improve the following deficits and impairments:  Abnormal gait, Impaired flexibility, Postural dysfunction, Improper body mechanics, Decreased range of motion  Visit Diagnosis: Right knee pain, unspecified chronicity  Stiffness of right knee, not elsewhere classified  Other abnormalities of gait and mobility  Muscle weakness (generalized)     Problem List There are no active problems to display for this patient.  [address] LPTA; [place]  Aldona Lento 03/21/2016, 12:43 PM  [place], [place], [address] Phone: [phone]   Fax:  [phone]  Name: ZOI SALO MRN: [id number] Date of Birth:  01/28/1951

## 2016-03-25 ENCOUNTER — Ambulatory Visit (HOSPITAL_COMMUNITY): Payer: BLUE CROSS/BLUE SHIELD | Admitting: Physical Therapy

## 2016-03-25 DIAGNOSIS — R2689 Other abnormalities of gait and mobility: Secondary | ICD-10-CM

## 2016-03-25 DIAGNOSIS — M6281 Muscle weakness (generalized): Secondary | ICD-10-CM

## 2016-03-25 DIAGNOSIS — M25561 Pain in right knee: Secondary | ICD-10-CM | POA: Diagnosis not present

## 2016-03-25 DIAGNOSIS — M25661 Stiffness of right knee, not elsewhere classified: Secondary | ICD-10-CM

## 2016-03-25 NOTE — Therapy (Signed)
[place], [place], [address] Phone: [phone]   Fax:  [phone]  Physical Therapy Treatment  Patient Details  Name: Stacey Mcneil MRN: [id number] Date of Birth: [birth year] Referring Provider: Nicholes Stairs, MD  Encounter Date: 03/25/2016      PT End of Session - 03/25/16 1118    Visit Number 6   Number of Visits 12   Date for PT Re-Evaluation 03/18/16   Authorization Type BCBS   Authorization Time Period 02/26/16 to 04/08/16   PT Start Time 1038   PT Stop Time 1120   PT Time Calculation (min) 42 min   Activity Tolerance No increased pain;Patient tolerated treatment well   Behavior During Therapy [facility] for tasks assessed/performed      Past Medical History:  Diagnosis Date  . Diabetes mellitus without complication ([place])   . Hypertension     No past surgical history on file.  There were no vitals filed for this visit.      Subjective Assessment - 03/25/16 1041    Subjective Pt reports only minimal pain today, 3/10   Currently in Pain? Yes   Pain Score 3    Pain Location Knee   Pain Orientation Right                         [facility] Adult PT Treatment/Exercise - 03/25/16 0001      Knee/Hip Exercises: Stretches   Active Hamstring Stretch Both;3 reps;30 seconds   Active Hamstring Stretch Limitations 12in step   Hip Flexor Stretch Both;3 reps;20 seconds   Hip Flexor Stretch Limitations standing on 8" box     Knee/Hip Exercises: Standing   Heel Raises Both;1 set;20 reps   Heel Raises Limitations toeraises 20 reps    Hip Abduction Both;15 reps   Abduction Limitations cueing for form   SLS with Vectors 5X3" each with 1 HHA     Knee/Hip Exercises: Sidelying   Hip ABduction Both;15 reps     Knee/Hip Exercises: Prone   Hamstring Curl 15 reps   Hamstring Curl Limitations RTB cueing for slow, controlled without hip movements   Straight Leg Raises 15 reps                   PT Short Term Goals - 02/26/16 1306      PT SHORT TERM GOAL #1   Title Pt will demo consistency and independence with her HEP to improve knee ROM and strength.    Time 2   Period Weeks   Status New     PT SHORT TERM GOAL #2   Title Pt will demo Rt knee flexion ROM to atleast 105 deg, to improve her technique with sit to stand activity.    Time 3   Period Weeks   Status New     PT SHORT TERM GOAL #3   Title Pt will demo equal weight shift with sit to stand activity throughout her session, without cues from therapist, to decrease wear and strain on her LLE.   Time 3   Period Weeks   Status New           PT Long Term Goals - 02/26/16 1308      PT LONG TERM GOAL #1   Title pt will demo imroved BLE strength to atleast 4+/5 MMT, to improve her stability and safety with functional activity at home.    Time 6  Period Weeks   Status New     PT LONG TERM GOAL #2   Title Pt will ascend/descend atleast 6 steps without handrails and with reciprocal pattern, to allow her to go downstairs to do her laundry throughout the day.   Time 6   Period Weeks   Status New     PT LONG TERM GOAL #3   Title Pt will demo improved functional strength evident by her ability to perform 5x sit to stand in less than 11 sec, without weight shift Lt and without use of UE support.    Time 6   Period Weeks   Status New     PT LONG TERM GOAL #4   Title Pt will perform TUG in less than 10 sec, to indicate she is at a decreased risk of falling out in the community.   Time 6   Period Weeks   Status New     PT LONG TERM GOAL #5   Title Pt will report no greater than 5/10 max pain with daily activity, to improve her QOL and decrease risk of knee replacement surgery at discharge.   Time 6   Period Weeks   Status New               Plan - 03/25/16 1119    Clinical Impression Statement Continued exercise progression with proximal and core strengthening.  Pt able to  complete all exercises without c/o pain or fatigue.  Pt reported no change in pain at end of session.    Rehab Potential Good   PT Frequency 2x / week   PT Duration --  decrease to 1x/week following 2 weeks and pt. comfortable wiht HEP   PT Treatment/Interventions ADLs/Self Care Home Management;Moist Heat;Cryotherapy;Gait training;Stair training;Functional mobility training;Therapeutic activities;Therapeutic exercise;Balance training;Neuromuscular re-education;Passive range of motion;Manual techniques;Dry needling   PT Next Visit Plan try to stick with gravity eliminated positions for LE strengthening, adding standing strength as tolerated to avoid increase wear on knees. hip flexibility (quad/hip flexor)   PT Home Exercise Plan supine bridge, sidelying clamshells with red TB       Patient will benefit from skilled therapeutic intervention in order to improve the following deficits and impairments:  Abnormal gait, Impaired flexibility, Postural dysfunction, Improper body mechanics, Decreased range of motion  Visit Diagnosis: Right knee pain, unspecified chronicity  Stiffness of right knee, not elsewhere classified  Other abnormalities of gait and mobility  Muscle weakness (generalized)     Problem List There are no active problems to display for this patient.   Teena Irani, PTA/CLT [phone]  03/25/2016, [place], [place], [address] Phone: [phone]   Fax:  [phone]  Name: Stacey Mcneil MRN: [id number] Date of Birth: Dec 28, 1950

## 2016-03-27 ENCOUNTER — Ambulatory Visit (HOSPITAL_COMMUNITY): Payer: BLUE CROSS/BLUE SHIELD | Admitting: Physical Therapy

## 2016-03-27 DIAGNOSIS — M6281 Muscle weakness (generalized): Secondary | ICD-10-CM

## 2016-03-27 DIAGNOSIS — M25561 Pain in right knee: Secondary | ICD-10-CM | POA: Diagnosis not present

## 2016-03-27 DIAGNOSIS — R2689 Other abnormalities of gait and mobility: Secondary | ICD-10-CM

## 2016-03-27 DIAGNOSIS — M25661 Stiffness of right knee, not elsewhere classified: Secondary | ICD-10-CM

## 2016-03-27 NOTE — Therapy (Signed)
[place], [place], [address] Phone: [phone]   Fax:  [phone]  Physical Therapy Treatment  Patient Details  Name: Stacey Mcneil MRN: [id number] Date of Birth: 09/28/1950 Referring Provider: Nicholes Stairs, MD  Encounter Date: 03/27/2016      PT End of Session - 03/27/16 1032    Visit Number 7   Number of Visits 12   Date for PT Re-Evaluation 03/18/16   Authorization Type BCBS   Authorization Time Period 02/26/16 to 04/08/16   PT Start Time 0952   PT Stop Time 1040   PT Time Calculation (min) 48 min   Activity Tolerance No increased pain;Patient tolerated treatment well   Behavior During Therapy [facility] for tasks assessed/performed      Past Medical History:  Diagnosis Date  . Diabetes mellitus without complication ([place])   . Hypertension     No past surgical history on file.  There were no vitals filed for this visit.      Subjective Assessment - 03/27/16 1002    Subjective Pt states she has a little discomfort but no pain today.   Currently in Pain? No/denies                         [facility] Adult PT Treatment/Exercise - 03/27/16 0001      Knee/Hip Exercises: Stretches   Active Hamstring Stretch Both;3 reps;30 seconds   Active Hamstring Stretch Limitations 12in step   Hip Flexor Stretch Both;3 reps;20 seconds   Hip Flexor Stretch Limitations 12" box     Knee/Hip Exercises: Standing   Heel Raises Both;1 set;20 reps   Heel Raises Limitations toeraises 20 reps    Hip Abduction Both;20 reps   Abduction Limitations cueing for form   SLS with Vectors 10X3" each with 1 HHA     Knee/Hip Exercises: Sidelying   Hip ABduction Both;20 reps     Knee/Hip Exercises: Prone   Hamstring Curl 20 reps   Hamstring Curl Limitations RTB cueing for slow, controlled without hip movements   Straight Leg Raises 20 reps                  PT Short Term Goals - 02/26/16 1306      PT SHORT TERM GOAL #1   Title Pt will demo consistency and independence with her HEP to improve knee ROM and strength.    Time 2   Period Weeks   Status New     PT SHORT TERM GOAL #2   Title Pt will demo Rt knee flexion ROM to atleast 105 deg, to improve her technique with sit to stand activity.    Time 3   Period Weeks   Status New     PT SHORT TERM GOAL #3   Title Pt will demo equal weight shift with sit to stand activity throughout her session, without cues from therapist, to decrease wear and strain on her LLE.   Time 3   Period Weeks   Status New           PT Long Term Goals - 02/26/16 1308      PT LONG TERM GOAL #1   Title pt will demo imroved BLE strength to atleast 4+/5 MMT, to improve her stability and safety with functional activity at home.    Time 6   Period Weeks   Status New     PT LONG TERM GOAL #2  Title Pt will ascend/descend atleast 6 steps without handrails and with reciprocal pattern, to allow her to go downstairs to do her laundry throughout the day.   Time 6   Period Weeks   Status New     PT LONG TERM GOAL #3   Title Pt will demo improved functional strength evident by her ability to perform 5x sit to stand in less than 11 sec, without weight shift Lt and without use of UE support.    Time 6   Period Weeks   Status New     PT LONG TERM GOAL #4   Title Pt will perform TUG in less than 10 sec, to indicate she is at a decreased risk of falling out in the community.   Time 6   Period Weeks   Status New     PT LONG TERM GOAL #5   Title Pt will report no greater than 5/10 max pain with daily activity, to improve her QOL and decrease risk of knee replacement surgery at discharge.   Time 6   Period Weeks   Status New               Plan - 03/27/16 1033    Clinical Impression Statement Continued with focus on proximal LE and core strengthening.  Able to increase reps of all exercises today without pain or fatigue.  Most challenging is  sidelying hip abduction and SLS vector exercises.  Pt requires postural cues with vector but minimal other cues.  No retrun of pain at end session.    Rehab Potential Good   PT Frequency 2x / week   PT Duration --  decrease to 1x/week following 2 weeks and pt. comfortable wiht HEP   PT Treatment/Interventions ADLs/Self Care Home Management;Moist Heat;Cryotherapy;Gait training;Stair training;Functional mobility training;Therapeutic activities;Therapeutic exercise;Balance training;Neuromuscular re-education;Passive range of motion;Manual techniques;Dry needling   PT Next Visit Plan try to stick with gravity eliminated positions for LE strengthening, adding standing strength as tolerated to avoid increase wear on knees. hip flexibility (quad/hip flexor)   PT Home Exercise Plan supine bridge, sidelying clamshells with red TB       Patient will benefit from skilled therapeutic intervention in order to improve the following deficits and impairments:  Abnormal gait, Impaired flexibility, Postural dysfunction, Improper body mechanics, Decreased range of motion  Visit Diagnosis: Right knee pain, unspecified chronicity  Stiffness of right knee, not elsewhere classified  Other abnormalities of gait and mobility  Muscle weakness (generalized)     Problem List There are no active problems to display for this patient.   Teena Irani, PTA/CLT [phone]  03/27/2016, 10:36 AM  [place] [address] [place], [place], [address] Phone: [phone]   Fax:  [phone]  Name: RACHELMARIE GAFFKE MRN: [id number] Date of Birth: 05/21/1950

## 2016-04-02 ENCOUNTER — Ambulatory Visit (HOSPITAL_COMMUNITY): Payer: BLUE CROSS/BLUE SHIELD | Admitting: Physical Therapy

## 2016-04-02 DIAGNOSIS — M25561 Pain in right knee: Secondary | ICD-10-CM

## 2016-04-02 DIAGNOSIS — M25661 Stiffness of right knee, not elsewhere classified: Secondary | ICD-10-CM

## 2016-04-02 DIAGNOSIS — M6281 Muscle weakness (generalized): Secondary | ICD-10-CM

## 2016-04-02 DIAGNOSIS — R2689 Other abnormalities of gait and mobility: Secondary | ICD-10-CM

## 2016-04-02 NOTE — Therapy (Signed)
[place], [place], [address] Phone: [phone]   Fax:  [phone]  Physical Therapy Treatment  Patient Details  Name: Stacey Mcneil MRN: [id number] Date of Birth: [birth year] Referring Provider: Nicholes Stairs, MD  Encounter Date: 04/02/2016      PT End of Session - 04/02/16 1109    Visit Number 8   Number of Visits 12   Date for PT Re-Evaluation 03/18/16   Authorization Type BCBS   Authorization Time Period 02/26/16 to 04/08/16   PT Start Time 1046  Pt arrived late to appointment    PT Stop Time 1112   PT Time Calculation (min) 26 min   Activity Tolerance No increased pain;Patient tolerated treatment well   Behavior During Therapy [facility] for tasks assessed/performed      Past Medical History:  Diagnosis Date  . Diabetes mellitus without complication ([place])   . Hypertension     No past surgical history on file.  There were no vitals filed for this visit.      Subjective Assessment - 04/02/16 1047    Subjective Pt states that she had a busy weekend. She has no pain currently.   Currently in Pain? No/denies                         [facility] Adult PT Treatment/Exercise - 04/02/16 0001      Knee/Hip Exercises: Stretches   Passive Hamstring Stretch Both;2 reps;30 seconds   Passive Hamstring Stretch Limitations 12" box    Gastroc Stretch Both;3 reps;30 seconds   Gastroc Stretch Limitations slantboard      Knee/Hip Exercises: Standing   Heel Raises Both;1 set;10 reps   Heel Raises Limitations single leg    Other Standing Knee Exercises hamstring curls 2x10 with 2# weight    Other Standing Knee Exercises standing EC on foam, trunk rotation x10 reps                 PT Education - 04/02/16 1217    Education provided Yes   Education Details discussed progress and possible d/c next visit   Person(s) Educated Patient   Methods Explanation   Comprehension Verbalized  understanding          PT Short Term Goals - 02/26/16 1306      PT SHORT TERM GOAL #1   Title Pt will demo consistency and independence with her HEP to improve knee ROM and strength.    Time 2   Period Weeks   Status New     PT SHORT TERM GOAL #2   Title Pt will demo Rt knee flexion ROM to atleast 105 deg, to improve her technique with sit to stand activity.    Time 3   Period Weeks   Status New     PT SHORT TERM GOAL #3   Title Pt will demo equal weight shift with sit to stand activity throughout her session, without cues from therapist, to decrease wear and strain on her LLE.   Time 3   Period Weeks   Status New           PT Long Term Goals - 02/26/16 1308      PT LONG TERM GOAL #1   Title pt will demo imroved BLE strength to atleast 4+/5 MMT, to improve her stability and safety with functional activity at home.    Time 6   Period Weeks   Status  New     PT LONG TERM GOAL #2   Title Pt will ascend/descend atleast 6 steps without handrails and with reciprocal pattern, to allow her to go downstairs to do her laundry throughout the day.   Time 6   Period Weeks   Status New     PT LONG TERM GOAL #3   Title Pt will demo improved functional strength evident by her ability to perform 5x sit to stand in less than 11 sec, without weight shift Lt and without use of UE support.    Time 6   Period Weeks   Status New     PT LONG TERM GOAL #4   Title Pt will perform TUG in less than 10 sec, to indicate she is at a decreased risk of falling out in the community.   Time 6   Period Weeks   Status New     PT LONG TERM GOAL #5   Title Pt will report no greater than 5/10 max pain with daily activity, to improve her QOL and decrease risk of knee replacement surgery at discharge.   Time 6   Period Weeks   Status New               Plan - 04/02/16 1112    Clinical Impression Statement Pt is making steady progress towards goals with overall reported improvements in  activity tolerance and pain. She was able to perform all exercises during today's session without increase in knee pain with report of muscle fatigue only. Discussed possible d/c next session due to pt progress and being pleased with her current functional level. She verbalized agreement with this.    Rehab Potential Good   PT Frequency 2x / week   PT Duration --  decrease to 1x/week following 2 weeks and pt. comfortable wiht HEP   PT Treatment/Interventions ADLs/Self Care Home Management;Moist Heat;Cryotherapy;Gait training;Stair training;Functional mobility training;Therapeutic activities;Therapeutic exercise;Balance training;Neuromuscular re-education;Passive range of motion;Manual techniques;Dry needling   PT Next Visit Plan d/c next visit with updated HEP    PT Home Exercise Plan supine bridge, sidelying clamshells with red TB    Consulted and Agree with Plan of Care Patient      Patient will benefit from skilled therapeutic intervention in order to improve the following deficits and impairments:  Abnormal gait, Impaired flexibility, Postural dysfunction, Improper body mechanics, Decreased range of motion  Visit Diagnosis: Right knee pain, unspecified chronicity  Stiffness of right knee, not elsewhere classified  Other abnormalities of gait and mobility  Muscle weakness (generalized)     Problem List There are no active problems to display for this patient.   12:20 PM,04/02/16 Elly Modena PT, DPT [facility] [address] [place], [place], [address] Phone: [phone]   Fax:  [phone]  Name: Stacey Mcneil MRN: [id number] Date of Birth: [birth year]

## 2016-04-04 ENCOUNTER — Ambulatory Visit (HOSPITAL_COMMUNITY): Payer: BLUE CROSS/BLUE SHIELD | Admitting: Physical Therapy

## 2016-04-04 DIAGNOSIS — R2689 Other abnormalities of gait and mobility: Secondary | ICD-10-CM

## 2016-04-04 DIAGNOSIS — M25661 Stiffness of right knee, not elsewhere classified: Secondary | ICD-10-CM

## 2016-04-04 DIAGNOSIS — M25561 Pain in right knee: Secondary | ICD-10-CM

## 2016-04-04 DIAGNOSIS — M6281 Muscle weakness (generalized): Secondary | ICD-10-CM

## 2016-04-04 NOTE — Therapy (Signed)
[place] [address] [place], [place], [address] Phone: [phone]   Fax:  [phone]  Physical Therapy Treatment (Discharge)  Patient Details  Name: Stacey Mcneil MRN: [id number] Date of Birth: [birth year] Referring Provider: Nicholes Stairs, MD  Encounter Date: 04/04/2016      PT End of Session - 04/04/16 1104    Visit Number 9   Number of Visits 9   Authorization Type BCBS   Authorization Time Period 02/26/16 to 04/08/16   PT Start Time 1034   PT Stop Time 65  DC today, no further skilled PT services needed    PT Time Calculation (min) 28 min   Activity Tolerance Patient tolerated treatment well   Behavior During Therapy [facility] for tasks assessed/performed      Past Medical History:  Diagnosis Date  . Diabetes mellitus without complication ([place])   . Hypertension     No past surgical history on file.  There were no vitals filed for this visit.      Subjective Assessment - 04/04/16 1037    Subjective Patient arrives today confirming that today is going to be her last day; she can't complain as things are going pretty well. She is trying to not let her pain become a handicap, she does have to ease into some activities. She still has trouble getting down onto her hands and knees. There is nothing she cannot do, she just has to take her time. She rates herself about an 85/100.    Pertinent History HTN, DM   How long can you sit comfortably? 12/29-unlimited    How long can you stand comfortably? 12/29- would have to move leg around but would be fairly unlimited    How long can you walk comfortably? 12/29- 30-45 minutes    Patient Stated Goals decrease pain and improve strength   Currently in Pain? No/denies            [facility] PT Assessment - 04/04/16 0001      Observation/Other Assessments   Focus on Therapeutic Outcomes (FOTO)  31% limited      AROM   Right Knee Flexion 114   Left Knee Flexion 110      Strength   Right Hip Flexion 4-/5   Right Hip Extension 4-/5   Right Hip ABduction 3+/5   Left Hip Flexion 4-/5   Left Hip Extension 3+/5   Left Hip ABduction 3+/5   Right Knee Flexion 5/5   Right Knee Extension 5/5   Left Knee Flexion 5/5   Left Knee Extension 5/5   Right Ankle Dorsiflexion 5/5   Left Ankle Dorsiflexion 5/5     Transfers   Five time sit to stand comments  9.2 no UEs      Ambulation/Gait   Gait Comments stairs WFL      Timed Up and Go Test   TUG Comments 8.6 no device                             PT Education - 04/04/16 1104    Education provided Yes   Education Details progress with skilled PT services, DC today, importance of regular physical activity in managing condition moving forward    Person(s) Educated Patient   Methods Explanation   Comprehension Verbalized understanding          PT Short Term Goals - 04/04/16 1054      PT SHORT  TERM GOAL #1   Title Pt will demo consistency and independence with her HEP to improve knee ROM and strength.    Baseline 12/29- going well, feels comfortable with them   Time 2   Period Weeks   Status Achieved     PT SHORT TERM GOAL #2   Title Pt will demo Rt knee flexion ROM to atleast 105 deg, to improve her technique with sit to stand activity.    Baseline 12/29- 114   Time 3   Period Weeks   Status Achieved     PT SHORT TERM GOAL #3   Title Pt will demo equal weight shift with sit to stand activity throughout her session, without cues from therapist, to decrease wear and strain on her LLE.   Time 3   Period Weeks   Status Achieved           PT Long Term Goals - 04/04/16 1055      PT LONG TERM GOAL #1   Title pt will demo imroved BLE strength to atleast 4+/5 MMT, to improve her stability and safety with functional activity at home.    Time 6   Period Weeks   Status Partially Met     PT LONG TERM GOAL #2   Title Pt will ascend/descend atleast 6 steps without handrails and  with reciprocal pattern, to allow her to go downstairs to do her laundry throughout the day.   Baseline 12/29- can do reciprocal pattern but usually uses rails at home/in community    Time 6   Period Weeks   Status Partially Met     PT LONG TERM GOAL #3   Title Pt will demo improved functional strength evident by her ability to perform 5x sit to stand in less than 11 sec, without weight shift Lt and without use of UE support.    Baseline 12/29- 9.2 no UEs    Time 6   Period Weeks   Status Achieved     PT LONG TERM GOAL #4   Title Pt will perform TUG in less than 10 sec, to indicate she is at a decreased risk of falling out in the community.   Baseline 12/29- 8.6 no device, no UEs    Time 6   Period Weeks   Status Achieved     PT LONG TERM GOAL #5   Title Pt will report no greater than 5/10 max pain with daily activity, to improve her QOL and decrease risk of knee replacement surgery at discharge.   Baseline 12/29-  can still get to 8/10 but this is mostly at night, not when she is up and moving; when she is moving and through the day, it only gets to 4-5/10   Time 6   Period Weeks   Status Partially Met               Plan - 04/04/16 1105    Clinical Impression Statement Re-assessment performed today. Patient doing very well and reports minimal difficulty with functional tasks and activities throughout her day; she subjectively rates herself as being 85/100 at this time. Examination reveals improved functional strength, R knee ROM, mobility/gait/balance/stair navigation, and improved functional task performance skills. She does continue to demonstrate some proximal weakness as well as mild difficulty with stairs [facility] but requires rails). She is comfortable with her current HEP and is already a [employer] member, denies interest in local walking club; discussed importance of regular physical activity moving forwards to manage  this condition. DC today due to high level of function.     Rehab Potential Good   PT Next Visit Plan DC today    Consulted and Agree with Plan of Care Patient      Patient will benefit from skilled therapeutic intervention in order to improve the following deficits and impairments:  Abnormal gait, Impaired flexibility, Postural dysfunction, Improper body mechanics, Decreased range of motion  Visit Diagnosis: Right knee pain, unspecified chronicity  Stiffness of right knee, not elsewhere classified  Other abnormalities of gait and mobility  Muscle weakness (generalized)     Problem List There are no active problems to display for this patient.  PHYSICAL THERAPY DISCHARGE SUMMARY  Visits from Start of Care: 9  Current functional level related to goals / functional outcomes: Patient doing very well with minimal functional limitations at this time. DC due to high level of function.    Remaining deficits: Mild difficulty with stairs, proximal weakness, mild R knee pain    Education / Equipment: DC today, importance of regular physical activity moving forward to manage condition  Plan: Patient agrees to discharge.  Patient goals were met. Patient is being discharged due to being pleased with the current functional level.  ?????      Deniece Ree PT, DPT [place] [address] [place], [place], [address] Phone: [phone]   Fax:  [phone]  Name: KYLER LERETTE MRN: [id number] Date of Birth: April 15, 1950

## 2016-04-08 ENCOUNTER — Ambulatory Visit (HOSPITAL_COMMUNITY): Payer: BLUE CROSS/BLUE SHIELD | Admitting: Physical Therapy

## 2016-04-11 ENCOUNTER — Ambulatory Visit (HOSPITAL_COMMUNITY): Payer: BLUE CROSS/BLUE SHIELD | Admitting: Physical Therapy

## 2016-09-03 ENCOUNTER — Other Ambulatory Visit (HOSPITAL_COMMUNITY)
Admission: RE | Admit: 2016-09-03 | Discharge: 2016-09-03 | Disposition: A | Discharge status: Home / Self Care | Payer: BLUE CROSS/BLUE SHIELD | Source: Other Acute Inpatient Hospital | Attending: Urology | Admitting: Urology

## 2016-09-03 ENCOUNTER — Ambulatory Visit (INDEPENDENT_AMBULATORY_CARE_PROVIDER_SITE_OTHER): Payer: BLUE CROSS/BLUE SHIELD | Admitting: Urology

## 2016-09-03 DIAGNOSIS — R310 Gross hematuria: Secondary | ICD-10-CM | POA: Diagnosis not present

## 2016-09-03 LAB — URINALYSIS, COMPLETE (UACMP) WITH MICROSCOPIC
Bilirubin Urine: NEGATIVE
Glucose, UA: NEGATIVE mg/dL
Ketones, ur: NEGATIVE mg/dL
Leukocytes, UA: NEGATIVE
Nitrite: NEGATIVE
Protein, ur: NEGATIVE mg/dL
Specific Gravity, Urine: 1.016 (ref 1.005–1.030)
pH: 6 (ref 5.0–8.0)

## 2016-09-08 ENCOUNTER — Other Ambulatory Visit: Payer: Self-pay | Admitting: Urology

## 2016-09-08 DIAGNOSIS — R310 Gross hematuria: Secondary | ICD-10-CM

## 2016-09-17 ENCOUNTER — Encounter (HOSPITAL_COMMUNITY): Payer: Self-pay

## 2016-09-17 ENCOUNTER — Ambulatory Visit (HOSPITAL_COMMUNITY)
Admission: RE | Admit: 2016-09-17 | Discharge: 2016-09-17 | Disposition: A | Discharge status: Home / Self Care | Payer: BLUE CROSS/BLUE SHIELD | Source: Ambulatory Visit | Attending: Urology | Admitting: Urology

## 2016-09-17 DIAGNOSIS — N289 Disorder of kidney and ureter, unspecified: Secondary | ICD-10-CM | POA: Insufficient documentation

## 2016-09-17 DIAGNOSIS — R911 Solitary pulmonary nodule: Secondary | ICD-10-CM | POA: Diagnosis not present

## 2016-09-17 DIAGNOSIS — D3502 Benign neoplasm of left adrenal gland: Secondary | ICD-10-CM | POA: Diagnosis not present

## 2016-09-17 DIAGNOSIS — R310 Gross hematuria: Secondary | ICD-10-CM | POA: Diagnosis present

## 2016-09-17 DIAGNOSIS — K769 Liver disease, unspecified: Secondary | ICD-10-CM | POA: Diagnosis not present

## 2016-09-17 LAB — POCT I-STAT CREATININE: Creatinine, Ser: 1 mg/dL (ref 0.44–1.00)

## 2016-09-17 MED ORDER — IOPAMIDOL (ISOVUE-300) INJECTION 61%
125.0000 mL | Freq: Once | INTRAVENOUS | Status: AC | PRN
  Administered 2016-09-17: 125 mL via INTRAVENOUS

## 2016-10-14 ENCOUNTER — Ambulatory Visit (INDEPENDENT_AMBULATORY_CARE_PROVIDER_SITE_OTHER): Payer: BLUE CROSS/BLUE SHIELD | Admitting: Urology

## 2016-10-14 DIAGNOSIS — R310 Gross hematuria: Secondary | ICD-10-CM | POA: Diagnosis not present

## 2017-01-06 ENCOUNTER — Encounter: Payer: Self-pay | Admitting: Gastroenterology

## 2017-01-14 ENCOUNTER — Ambulatory Visit (INDEPENDENT_AMBULATORY_CARE_PROVIDER_SITE_OTHER): Payer: BLUE CROSS/BLUE SHIELD | Admitting: Gastroenterology

## 2017-01-14 ENCOUNTER — Encounter: Payer: Self-pay | Admitting: *Deleted

## 2017-01-14 ENCOUNTER — Encounter: Payer: Self-pay | Admitting: Gastroenterology

## 2017-01-14 ENCOUNTER — Other Ambulatory Visit: Payer: Self-pay | Admitting: *Deleted

## 2017-01-14 DIAGNOSIS — R1013 Epigastric pain: Secondary | ICD-10-CM

## 2017-01-14 DIAGNOSIS — R1310 Dysphagia, unspecified: Secondary | ICD-10-CM

## 2017-01-14 MED ORDER — LIDOCAINE VISCOUS 2 % MT SOLN: OROMUCOSAL | 1 refills | Status: DC

## 2017-01-14 NOTE — Assessment & Plan Note (Addendum)
DIFFERENTIAL DIAGNOSIS INCLUDES: H PYLORI GASTRITIS, UNCONTROLLED GERD, PYLORIC STENOSIS, ESOPHAGEAL STRICTURE, LESS LIKELY GE JUNCTION TUMOR, GASTRIC OR PANCREATIC CA OR CHRONIC MESENTERIC ISCHEMIA   COMPLETE UPPER ENDOSCOPY WITH POSSIBLE ESOPHAGEAL OR PYLORIC DILATION TUES OCT 16.PHENERGAN 12.5 MG IV IN PREOP. DISCUSSED PROCEDURE, BENEFITS, & RISKS: < 1% chance of medication reaction, bleeding, OR perforation.  TAKE DEXILANT DAILY. VISCOUS LIDOCAINE IF NEEDED AVOID REFLUX TRIGGERS. SEE INFO BELOW. FOLLOW UP IN 3 MOS.

## 2017-01-14 NOTE — Progress Notes (Signed)
Subjective:    Patient ID: Stacey Mcneil, female    DOB: 1950/10/30, 66 y.o.   MRN: [id number]  Joyice Faster, FNP  HPI Has epigastric pain and if she doesn't eat it will continue to hurt and then it gets better with food. Just hurts and feels like a contraction.FOOD MAKES IT BETTER IMMEDIATELY. AFTER EATING GETS GAS AND STARTS BURPING AND TAKES BAKING SODA. SOMETIMES FEEL DIFFERENT THAN HEARTBURN. TRIED OMEPRAZOLE: NO HELP ALSO TRIES TO EAT SALT TO SETTLE. FEELS NAUSEATED BUT DOESN'T VOMIT. KEPT BURPING AND BURPING ON MON NIGHT. NO ETOH OR NO NO ASPIRIN, BC/GOODY POWDERS, IBUPROFEN/MOTRIN, OR NAPROXEN/ALEVE. BMs: NO PROBLEM DUE TO METFORMIN. DOESN'T TAKE MEDS SOMETIMES FOR 3-4 DAYS. FEELS LIKE FOOD CAN SIT IN MIDDLE OF ABDOMEN. CAN HAVE LOWER ABDOMINAL PAIN AS WELL.  PT DENIES FEVER, CHILLS, HEMATOCHEZIA, HEMATEMESIS, nausea, vomiting, melena, diarrhea, CHEST PAIN, SHORTNESS OF BREATH, CHANGE IN BOWEL IN HABITS, constipation, problems swallowing, problems with sedation, OR heartburn or indigestion.  Past Medical History:  Diagnosis Date  . Diabetes mellitus without complication ([place])   . Hypertension    Past Surgical History:  Procedure Laterality Date  . COLONOSCOPY  02/2016   No Known Allergies  Current Outpatient Prescriptions  Medication Sig Dispense Refill  . Acetaminophen (ARTHRITIS PAIN PO) Take by mouth as needed.    . hydrochlorothiazide (HYDRODIURIL) 25 MG tablet Take 25 mg by mouth daily.    Marland Kitchen lisinopril (PRINIVIL,ZESTRIL) 10 MG tablet 10 mg daily.    . metFORMIN (GLUCOPHAGE) 500 MG tablet Take 500 mg by mouth daily.    . metoprolol succinate (TOPROL-XL) 50 MG 24 hr tablet Take 50 mg by mouth daily.    . Multiple Vitamins-Minerals (MULTIVITAMIN WITH MINERALS) tablet Take by mouth daily.    . Omega-3 Fatty Acids (FISH OIL) 1200 MG CAPS Take by mouth. 2-3 times per week    . omeprazole (PRILOSEC) 40 MG capsule Take 40 mg by mouth daily.     Family History    Problem Relation Age of Onset  . Colon cancer Neg Hx   . Colon polyps Neg Hx    Social History   Social History  . Marital status: Single    Spouse name: N/A  . Number of children: N/A  . Years of education: N/A   Social History Main Topics  . Smoking status: Never Smoker  . Smokeless tobacco: Never Used  . Alcohol use No  . Drug use: No  . 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    Objective:   Physical Exam  Constitutional: She is oriented to person, place, and time. She appears well-developed and well-nourished. No distress.  HENT:  Head: Normocephalic and atraumatic.  Mouth/Throat: Oropharynx is clear and moist. No oropharyngeal exudate.  Eyes: Pupils are equal, round, and reactive to light. No scleral icterus.  Neck: Normal range of motion. Neck supple.  Cardiovascular: Normal rate, regular rhythm and normal heart sounds.   Pulmonary/Chest: Effort normal and breath sounds normal. No respiratory distress.  Abdominal: Soft. Bowel sounds are normal. She exhibits no distension. There is no tenderness.  Musculoskeletal: She exhibits no edema.  Lymphadenopathy:    She has no cervical adenopathy.  Neurological: She is alert and oriented to person, place, and time.  NO FOCAL DEFICITS  Psychiatric: She has a normal mood and affect.  Vitals reviewed.  Assessment & Plan:

## 2017-01-14 NOTE — Patient Instructions (Addendum)
COMPLETE UPPER ENDOSCOPY TUES OCT 16.  STOP OMEPRAZOLE. START DEXILANT DAILY.  AVOID REFLUX TRIGGERS. SEE INFO BELOW.   USE VISCOUS LIDOCAINE 2 TSP EVERY 4HRS TO RELIEVE FOR CHEST, OR UPPER ABDOMINAL PAIN OR HEARTBURN. USE NO MORE THAN 8 DOSES A DAY. IT WILL YOUR MOUTH, ESOPHAGUS, AND STOMACH NUMB.  FOLLOW UP IN 3 MOS.       Lifestyle and home remedies TO MANAGE REFLUX/CHEST PAIN  You may eliminate or reduce the frequency of heartburn by making the following lifestyle changes:  . Control your weight. Being overweight is a major risk factor for heartburn and GERD. Excess pounds put pressure on your abdomen, pushing up your stomach and causing acid to back up into your esophagus.   . Eat smaller meals. 4 TO 6 MEALS A DAY. This reduces pressure on the lower esophageal sphincter, helping to prevent the valve from opening and acid from washing back into your esophagus.   Dolphus Jenny your belt. Clothes that fit tightly around your waist put pressure on your abdomen and the lower esophageal sphincter.   . Eliminate heartburn triggers. Everyone has specific triggers. Common triggers such as fatty or fried foods, spicy food, tomato sauce, carbonated beverages, alcohol, chocolate, mint, garlic, onion, caffeine and nicotine may make heartburn worse.   Marland Kitchen Avoid stooping or bending. Tying your shoes is OK. Bending over for longer periods to weed your garden isn't, especially soon after eating.   . Don't lie down after a meal. Wait at least three to four hours after eating before going to bed, and don't lie down right after eating.   Marland Kitchen PUT THE HEAD OF YOUR BED ON 6 INCH BLOCKS.   Alternative medicine . Several home remedies exist for treating GERD, but they provide only temporary relief. They include drinking baking soda (sodium bicarbonate) added to water or drinking other fluids such as baking soda mixed with cream of tartar and water.  . Although these liquids create temporary relief by  neutralizing, washing away or buffering acids, eventually they aggravate the situation by adding gas and fluid to your stomach, increasing pressure and causing more acid reflux. Further, adding more sodium to your diet may increase your blood pressure and add stress to your heart, and excessive bicarbonate ingestion can alter the acid-base balance in your body.

## 2017-01-15 NOTE — Progress Notes (Signed)
cc'ed to pcp °

## 2017-01-15 NOTE — Progress Notes (Signed)
On recall  °

## 2017-01-20 ENCOUNTER — Encounter (HOSPITAL_COMMUNITY): Payer: Self-pay | Admitting: Anesthesiology

## 2017-01-20 ENCOUNTER — Encounter (HOSPITAL_COMMUNITY)
Admission: RE | Disposition: A | Discharge status: Home / Self Care | Payer: Self-pay | Source: Ambulatory Visit | Attending: Gastroenterology

## 2017-01-20 ENCOUNTER — Ambulatory Visit (HOSPITAL_COMMUNITY)
Admission: RE | Admit: 2017-01-20 | Discharge: 2017-01-20 | Disposition: A | Discharge status: Home / Self Care | Payer: BLUE CROSS/BLUE SHIELD | Source: Ambulatory Visit | Attending: Gastroenterology | Admitting: Gastroenterology

## 2017-01-20 ENCOUNTER — Encounter (HOSPITAL_COMMUNITY): Payer: Self-pay | Admitting: *Deleted

## 2017-01-20 DIAGNOSIS — K2970 Gastritis, unspecified, without bleeding: Secondary | ICD-10-CM | POA: Insufficient documentation

## 2017-01-20 DIAGNOSIS — I10 Essential (primary) hypertension: Secondary | ICD-10-CM | POA: Insufficient documentation

## 2017-01-20 DIAGNOSIS — Z79899 Other long term (current) drug therapy: Secondary | ICD-10-CM | POA: Diagnosis not present

## 2017-01-20 DIAGNOSIS — K317 Polyp of stomach and duodenum: Secondary | ICD-10-CM | POA: Insufficient documentation

## 2017-01-20 DIAGNOSIS — K3189 Other diseases of stomach and duodenum: Secondary | ICD-10-CM | POA: Insufficient documentation

## 2017-01-20 DIAGNOSIS — E119 Type 2 diabetes mellitus without complications: Secondary | ICD-10-CM | POA: Diagnosis not present

## 2017-01-20 DIAGNOSIS — R1013 Epigastric pain: Secondary | ICD-10-CM | POA: Diagnosis not present

## 2017-01-20 DIAGNOSIS — K219 Gastro-esophageal reflux disease without esophagitis: Secondary | ICD-10-CM | POA: Diagnosis not present

## 2017-01-20 DIAGNOSIS — R1310 Dysphagia, unspecified: Secondary | ICD-10-CM

## 2017-01-20 DIAGNOSIS — K222 Esophageal obstruction: Secondary | ICD-10-CM | POA: Insufficient documentation

## 2017-01-20 DIAGNOSIS — Z7984 Long term (current) use of oral hypoglycemic drugs: Secondary | ICD-10-CM | POA: Diagnosis not present

## 2017-01-20 HISTORY — PX: ESOPHAGOGASTRODUODENOSCOPY: SHX5428

## 2017-01-20 HISTORY — PX: SAVORY DILATION: SHX5439

## 2017-01-20 LAB — GLUCOSE, CAPILLARY: Glucose-Capillary: 122 mg/dL — ABNORMAL HIGH (ref 65–99)

## 2017-01-20 SURGERY — EGD (ESOPHAGOGASTRODUODENOSCOPY)
Anesthesia: Moderate Sedation

## 2017-01-20 MED ORDER — SODIUM CHLORIDE 0.9 % IV SOLN
INTRAVENOUS | Status: DC
  Administered 2017-01-20: 07:00:00 via INTRAVENOUS

## 2017-01-20 MED ORDER — LIDOCAINE VISCOUS 2 % MT SOLN
OROMUCOSAL | Status: AC
  Filled 2017-01-20: qty 15

## 2017-01-20 MED ORDER — STERILE WATER FOR IRRIGATION IR SOLN
Status: DC | PRN
  Administered 2017-01-20: 08:00:00

## 2017-01-20 MED ORDER — LIDOCAINE VISCOUS 2 % MT SOLN
OROMUCOSAL | Status: DC | PRN
  Administered 2017-01-20: 1 via OROMUCOSAL

## 2017-01-20 MED ORDER — SODIUM CHLORIDE 0.9% FLUSH
INTRAVENOUS | Status: AC
  Filled 2017-01-20: qty 10

## 2017-01-20 MED ORDER — MIDAZOLAM HCL 5 MG/5ML IJ SOLN
INTRAMUSCULAR | Status: AC
  Filled 2017-01-20: qty 10

## 2017-01-20 MED ORDER — MIDAZOLAM HCL 5 MG/5ML IJ SOLN
INTRAMUSCULAR | Status: DC | PRN
  Administered 2017-01-20: 1 mg via INTRAVENOUS
  Administered 2017-01-20: 2 mg via INTRAVENOUS
  Administered 2017-01-20: 1 mg via INTRAVENOUS

## 2017-01-20 MED ORDER — MEPERIDINE HCL 100 MG/ML IJ SOLN
INTRAMUSCULAR | Status: AC
  Filled 2017-01-20: qty 2

## 2017-01-20 MED ORDER — PROMETHAZINE HCL 25 MG/ML IJ SOLN
INTRAMUSCULAR | Status: AC
  Filled 2017-01-20: qty 1

## 2017-01-20 MED ORDER — MEPERIDINE HCL 100 MG/ML IJ SOLN
INTRAMUSCULAR | Status: DC | PRN
  Administered 2017-01-20: 50 mg
  Administered 2017-01-20: 25 mg

## 2017-01-20 MED ORDER — PROMETHAZINE HCL 25 MG/ML IJ SOLN
12.5000 mg | Freq: Once | INTRAMUSCULAR | Status: AC
  Administered 2017-01-20: 12.5 mg via INTRAVENOUS

## 2017-01-20 NOTE — H&P (View-Only) (Signed)
Subjective:    Patient ID: Stacey Mcneil, female    DOB: 12-04-1950, 66 y.o.   MRN: [id number]  Joyice Faster, FNP  HPI Has epigastric pain and if she doesn't eat it will continue to hurt and then it gets better with food. Just hurts and feels like a contraction.FOOD MAKES IT BETTER IMMEDIATELY. AFTER EATING GETS GAS AND STARTS BURPING AND TAKES BAKING SODA. SOMETIMES FEEL DIFFERENT THAN HEARTBURN. TRIED OMEPRAZOLE: NO HELP ALSO TRIES TO EAT SALT TO SETTLE. FEELS NAUSEATED BUT DOESN'T VOMIT. KEPT BURPING AND BURPING ON MON NIGHT. NO ETOH OR NO NO ASPIRIN, BC/GOODY POWDERS, IBUPROFEN/MOTRIN, OR NAPROXEN/ALEVE. BMs: NO PROBLEM DUE TO METFORMIN. DOESN'T TAKE MEDS SOMETIMES FOR 3-4 DAYS. FEELS LIKE FOOD CAN SIT IN MIDDLE OF ABDOMEN. CAN HAVE LOWER ABDOMINAL PAIN AS WELL.  PT DENIES FEVER, CHILLS, HEMATOCHEZIA, HEMATEMESIS, nausea, vomiting, melena, diarrhea, CHEST PAIN, SHORTNESS OF BREATH, CHANGE IN BOWEL IN HABITS, constipation, problems swallowing, problems with sedation, OR heartburn or indigestion.  Past Medical History:  Diagnosis Date  . Diabetes mellitus without complication ([place])   . Hypertension    Past Surgical History:  Procedure Laterality Date  . COLONOSCOPY  02/2016   No Known Allergies  Current Outpatient Prescriptions  Medication Sig Dispense Refill  . Acetaminophen (ARTHRITIS PAIN PO) Take by mouth as needed.    . hydrochlorothiazide (HYDRODIURIL) 25 MG tablet Take 25 mg by mouth daily.    Marland Kitchen lisinopril (PRINIVIL,ZESTRIL) 10 MG tablet 10 mg daily.    . metFORMIN (GLUCOPHAGE) 500 MG tablet Take 500 mg by mouth daily.    . metoprolol succinate (TOPROL-XL) 50 MG 24 hr tablet Take 50 mg by mouth daily.    . Multiple Vitamins-Minerals (MULTIVITAMIN WITH MINERALS) tablet Take by mouth daily.    . Omega-3 Fatty Acids (FISH OIL) 1200 MG CAPS Take by mouth. 2-3 times per week    . omeprazole (PRILOSEC) 40 MG capsule Take 40 mg by mouth daily.     Family History    Problem Relation Age of Onset  . Colon cancer Neg Hx   . Colon polyps Neg Hx    Social History   Social History  . Marital status: Single    Spouse name: N/A  . Number of children: N/A  . Years of education: N/A   Social History Main Topics  . Smoking status: Never Smoker  . Smokeless tobacco: Never Used  . Alcohol use No  . Drug use: No  . 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    Objective:   Physical Exam  Constitutional: She is oriented to person, place, and time. She appears well-developed and well-nourished. No distress.  HENT:  Head: Normocephalic and atraumatic.  Mouth/Throat: Oropharynx is clear and moist. No oropharyngeal exudate.  Eyes: Pupils are equal, round, and reactive to light. No scleral icterus.  Neck: Normal range of motion. Neck supple.  Cardiovascular: Normal rate, regular rhythm and normal heart sounds.   Pulmonary/Chest: Effort normal and breath sounds normal. No respiratory distress.  Abdominal: Soft. Bowel sounds are normal. She exhibits no distension. There is no tenderness.  Musculoskeletal: She exhibits no edema.  Lymphadenopathy:    She has no cervical adenopathy.  Neurological: She is alert and oriented to person, place, and time.  NO FOCAL DEFICITS  Psychiatric: She has a normal mood and affect.  Vitals reviewed.  Assessment & Plan:

## 2017-01-20 NOTE — Interval H&P Note (Signed)
History and Physical Interval Note:  01/20/2017 7:22 AM  Stacey Mcneil  has presented today for surgery, with the diagnosis of Dyspepsia, dysphagia  The various methods of treatment have been discussed with the patient and family. After consideration of risks, benefits and other options for treatment, the patient has consented to  Procedure(s) with comments: ESOPHAGOGASTRODUODENOSCOPY (EGD) (N/A) - 7:30AM SAVORY DILATION (N/A) as a surgical intervention .  The patient's history has been reviewed, patient examined, no change in status, stable for surgery.  I have reviewed the patient's chart and labs.  Questions were answered to the patient's satisfaction.     [employer]

## 2017-01-20 NOTE — Op Note (Signed)
[facility] Patient Name: Stacey Mcneil Procedure Date: 01/20/2017 7:11 AM MRN: [id number] Date of Birth: 10-20-1950 Attending MD: Barney Drain MD, MD CSN: [id number] Age: 66 Admit Type: Outpatient Procedure:                Upper GI endoscopy with COLD FORCEPS                            BIOPSY/ESOPHAGEAL DILATION Indications:              Dyspepsia, Dysphagia Providers:                Barney Drain MD, MD, Janeece Riggers, RN, Tammy Vaught,                            RN Referring MD:              Medicines:                Promethazine 12.5 mg IV, Meperidine 75 mg IV,                            Midazolam 4 mg IV Complications:            No immediate complications. Estimated Blood Loss:     Estimated blood loss was minimal. Procedure:                Pre-Anesthesia Assessment:                           - Prior to the procedure, a History and Physical                            was performed, and patient medications and                            allergies were reviewed. The patient's tolerance of                            previous anesthesia was also reviewed. The risks                            and benefits of the procedure and the sedation                            options and risks were discussed with the patient.                            All questions were answered, and informed consent                            was obtained. Prior Anticoagulants: The patient has                            taken no previous anticoagulant or antiplatelet  agents. ASA Grade Assessment: II - A patient with                            mild systemic disease. After reviewing the risks                            and benefits, the patient was deemed in                            satisfactory condition to undergo the procedure.                            After obtaining informed consent, the endoscope was                            passed under direct vision. Throughout the                             procedure, the patient's blood pressure, pulse, and                            oxygen saturations were monitored continuously. The                            EG-[id number] ([id number]) scope was introduced through the                            mouth, and advanced to the second part of duodenum.                            The upper GI endoscopy was accomplished without                            difficulty. The patient tolerated the procedure                            fairly well. Scope In: 7:39:32 AM Scope Out: 7:49:28 AM Total Procedure Duration: 0 hours 9 minutes 56 seconds  Findings:      A moderate Schatzki ring (acquired) was found at the gastroesophageal       junction. A guidewire was placed and the scope was withdrawn. Dilation       was performed with a Savary dilator with mild resistance at 15 mm, 16 mm       and 17 mm. Estimated blood loss: none.      Patchy mild inflammation characterized by congestion (edema) and       erythema was found on the greater curvature of the stomach and in the       gastric antrum. Biopsies were taken with a cold forceps for Helicobacter       pylori testing.      A few small sessile polyps were found in the gastric body. This was       biopsied with a cold forceps for histology.      The examined duodenum was normal. Impression:               -  DYSPHAGIA DUE TO Moderate Schatzki ring/GERD                           - DYSPEPSIA DUE TO Gastritis/GERD                           - A few gastric polyps. Moderate Sedation:      Moderate (conscious) sedation was administered by the endoscopy nurse       and supervised by the endoscopist. The following parameters were       monitored: oxygen saturation, heart rate, blood pressure, and response       to care. Total physician intraservice time was 20 minutes. Recommendation:           - Await pathology results.                           - High fiber diet and low fat diet. CONTINUE  WEIGHT                            LOSS EFFORT.                           - Continue present medications. CONTINUE DEXILANT.                           - Return to my office in 3 months.                           - Patient has a contact number available for                            emergencies. The signs and symptoms of potential                            delayed complications were discussed with the                            patient. Return to normal activities tomorrow.                            Written discharge instructions were provided to the                            patient. Procedure Code(s):        --- Professional ---                           [phone], Esophagogastroduodenoscopy, flexible,                            transoral; with insertion of guide wire followed by                            passage of dilator(s) through esophagus over guide  wire                           [id number], Esophagogastroduodenoscopy, flexible,                            transoral; with biopsy, single or multiple                           [id number], Moderate sedation services provided by the                            same physician or other qualified health care                            professional performing the diagnostic or                            therapeutic service that the sedation supports,                            requiring the presence of an independent trained                            observer to assist in the monitoring of the                            patient's level of consciousness and physiological                            status; initial 15 minutes of intraservice time,                            patient age 63 years or older Diagnosis Code(s):        --- Professional ---                           K22.2, Esophageal obstruction                           K29.70, Gastritis, unspecified, without bleeding                           K31.7, Polyp of stomach  and duodenum                           R10.13, Epigastric pain                           R13.10, Dysphagia, unspecified CPT copyright 2016 [employer]. All rights reserved. The codes documented in this report are preliminary and upon coder review may  be revised to meet current compliance requirements. Barney Drain, MD Barney Drain MD, MD 01/20/2017 7:59:42 AM This report has been signed electronically. Number of Addenda: 0

## 2017-01-20 NOTE — Discharge Instructions (Signed)
YOUR DIFFICULTY SWALLOWING IS DUE TO REFLUX AND THE NARROWING AT THE BASE OF YOUR ESOPHAGUS. YOUR UPPER ABDOMINAL SYMPTOMS ARE DUE TO gastritis AND REFLUX. YOU HAVE BENIGN STOMACH POLYPS. I biopsied your stomach & POLYPS. I STRETCHED YOUR ESOPHAGUS.   DRINK WATER TO KEEP YOUR URINE LIGHT YELLOW.  CONTINUE YOUR WEIGHT LOSS EFFORTS.  FOLLOW A HIGH FIBER/LOW FAT DIET. SEE INFO BELOW.  CONTINUE DEXILANT  YOUR BIOPSY RESULTS WILL BE AVAILABLE IN MY CHART AFTER OCT 20 AND MY OFFICE WILL CONTACT YOU IN 10-14 DAYS WITH YOUR RESULTS.   FOLLOW UP IN 3 MOS. UPPER ENDOSCOPY AFTER CARE Read the instructions outlined below and refer to this sheet in the next week. These discharge instructions provide you with general information on caring for yourself after you leave the hospital. While your treatment has been planned according to the most current medical practices available, unavoidable complications occasionally occur. If you have any problems or questions after discharge, call DR. Micheala Morissette, [phone].  ACTIVITY  You may resume your regular activity, but move at a slower pace for the next 24 hours.   Take frequent rest periods for the next 24 hours.   Walking will help get rid of the air and reduce the bloated feeling in your belly (abdomen).   No driving for 24 hours (because of the medicine (anesthesia) used during the test).   You may shower.   Do not sign any important legal documents or operate any machinery for 24 hours (because of the anesthesia used during the test).    NUTRITION  Drink plenty of fluids.   You may resume your normal diet as instructed by your doctor.   Begin with a light meal and progress to your normal diet. Heavy or fried foods are harder to digest and may make you feel sick to your stomach (nauseated).   Avoid alcoholic beverages for 24 hours or as instructed.    MEDICATIONS  You may resume your normal medications.   WHAT YOU CAN EXPECT TODAY  Some  feelings of bloating in the abdomen.   Passage of more gas than usual.    IF YOU HAD A BIOPSY TAKEN DURING THE UPPER ENDOSCOPY:  Eat a soft diet IF YOU HAVE NAUSEA, BLOATING, ABDOMINAL PAIN, OR VOMITING.    FINDING OUT THE RESULTS OF YOUR TEST Not all test results are available during your visit. DR. Oneida Alar WILL CALL YOU WITHIN 14 DAYS OF YOUR PROCEDUE WITH YOUR RESULTS. Do not assume everything is normal if you have not heard from DR. Ariyon Gerstenberger, CALL HER OFFICE AT [phone].  SEEK IMMEDIATE MEDICAL ATTENTION AND CALL THE OFFICE: [phone] IF:  You have more than a spotting of blood in your stool.   Your belly is swollen (abdominal distention).   You are nauseated or vomiting.   You have a temperature over 101F.   You have abdominal pain or discomfort that is severe or gets worse throughout the day.  Gastritis  Gastritis is an inflammation (the body's way of reacting to injury and/or infection) of the stomach. It is often caused by viral or bacterial (germ) infections. It can also be caused BY ASPIRIN, BC/GOODY POWDER'S, (IBUPROFEN) MOTRIN, OR ALEVE (NAPROXEN), chemicals (including alcohol), SPICY FOODS, and medications. This illness may be associated with generalized malaise (feeling tired, not well), UPPER ABDOMINAL STOMACH cramps, and fever. One common bacterial cause of gastritis is an organism known as H. Pylori. This can be treated with antibiotics.     ESOPHAGEAL RING  Esophageal rings/strictures  can be caused by stomach acid backing up into the tube that carries food from the mouth down to the stomach (lower esophagus).  TREATMENT There are a number of medicines used to treat reflux/stricture, including: Antacids.  Proton-pump inhibitors: DEXILANT ZANTAC OR PEPCID  HOME CARE INSTRUCTIONS Eat 2-3 hours before going to bed.  Try to reach and maintain a healthy weight.  Do not eat just a few very large meals. Instead, eat 4 TO 6 smaller meals throughout the day.    Try to identify foods and beverages that make your symptoms worse, and avoid these.  Avoid tight clothing.  Do not exercise right after eating.   High-Fiber Diet A high-fiber diet changes your normal diet to include more whole grains, legumes, fruits, and vegetables. Changes in the diet involve replacing refined carbohydrates with unrefined foods. The calorie level of the diet is essentially unchanged. The Dietary Reference Intake (recommended amount) for adult males is 38 grams per day. For adult females, it is 25 grams per day. Pregnant and lactating women should consume 28 grams of fiber per day. Fiber is the intact part of a plant that is not broken down during digestion. Functional fiber is fiber that has been isolated from the plant to provide a beneficial effect in the body.  PURPOSE  Increase stool bulk.   Ease and regulate bowel movements.   Lower cholesterol.   REDUCE RISK OF COLON CANCER  INDICATIONS THAT YOU NEED MORE FIBER  Constipation and hemorrhoids.   Uncomplicated diverticulosis (intestine condition) and irritable bowel syndrome.   Weight management.   As a protective measure against hardening of the arteries (atherosclerosis), diabetes, and cancer.   GUIDELINES FOR INCREASING FIBER IN THE DIET  Start adding fiber to the diet slowly. A gradual increase of about 5 more grams (2 slices of whole-wheat bread, 2 servings of most fruits or vegetables, or 1 bowl of high-fiber cereal) per day is best. Too rapid an increase in fiber may result in constipation, flatulence, and bloating.   Drink enough water and fluids to keep your urine clear or pale yellow. Water, juice, or caffeine-free drinks are recommended. Not drinking enough fluid may cause constipation.   Eat a variety of high-fiber foods rather than one type of fiber.   Try to increase your intake of fiber through using high-fiber foods rather than fiber pills or supplements that contain small amounts of fiber.    The goal is to change the types of food eaten. Do not supplement your present diet with high-fiber foods, but replace foods in your present diet.   INCLUDE A VARIETY OF FIBER SOURCES  Replace refined and processed grains with whole grains, canned fruits with fresh fruits, and incorporate other fiber sources. White rice, white breads, and most bakery goods contain little or no fiber.   Brown whole-grain rice, buckwheat oats, and many fruits and vegetables are all good sources of fiber. These include: broccoli, Brussels sprouts, cabbage, cauliflower, beets, sweet potatoes, white potatoes (skin on), carrots, tomatoes, eggplant, squash, berries, fresh fruits, and dried fruits.   Cereals appear to be the richest source of fiber. Cereal fiber is found in whole grains and bran. Bran is the fiber-rich outer coat of cereal grain, which is largely removed in refining. In whole-grain cereals, the bran remains. In breakfast cereals, the largest amount of fiber is found in those with "bran" in their names. The fiber content is sometimes indicated on the label.   You may need to include additional fruits  and vegetables each day.   In baking, for 1 cup white flour, you may use the following substitutions:   1 cup whole-wheat flour minus 2 tablespoons.   1/2 cup white flour plus 1/2 cup whole-wheat flour.   Low-Fat Diet BREADS, CEREALS, PASTA, RICE, DRIED PEAS, AND BEANS These products are high in carbohydrates and most are low in fat. Therefore, they can be increased in the diet as substitutes for fatty foods. They too, however, contain calories and should not be eaten in excess. Cereals can be eaten for snacks as well as for breakfast.   FRUITS AND VEGETABLES It is good to eat fruits and vegetables. Besides being sources of fiber, both are rich in vitamins and some minerals. They help you get the daily allowances of these nutrients. Fruits and vegetables can be used for snacks and  desserts.  MEATS Limit lean meat, chicken, [place], and fish to no more than 6 ounces per day. Beef, Pork, and Lamb Use lean cuts of beef, pork, and lamb. Lean cuts include:  Extra-lean ground beef.  Arm roast.  Sirloin tip.  Center-cut ham.  Round steak.  Loin chops.  Rump roast.  Tenderloin.  Trim all fat off the outside of meats before cooking. It is not necessary to severely decrease the intake of red meat, but lean choices should be made. Lean meat is rich in protein and contains a highly absorbable form of iron. Premenopausal women, in particular, should avoid reducing lean red meat because this could increase the risk for low red blood cells (iron-deficiency anemia).  Chicken and [place] These are good sources of protein. The fat of poultry can be reduced by removing the skin and underlying fat layers before cooking. Chicken and [place] can be substituted for lean red meat in the diet. Poultry should not be fried or covered with high-fat sauces. Fish and Shellfish Fish is a good source of protein. Shellfish contain cholesterol, but they usually are low in saturated fatty acids. The preparation of fish is important. Like chicken and [place], they should not be fried or covered with high-fat sauces. EGGS Egg whites contain no fat or cholesterol. They can be eaten often. Try 1 to 2 egg whites instead of whole eggs in recipes or use egg substitutes that do not contain yolk. MILK AND DAIRY PRODUCTS Use skim or 1% milk instead of 2% or whole milk. Decrease whole milk, natural, and processed cheeses. Use nonfat or low-fat (2%) cottage cheese or low-fat cheeses made from vegetable oils. Choose nonfat or low-fat (1 to 2%) yogurt. Experiment with evaporated skim milk in recipes that call for heavy cream. Substitute low-fat yogurt or low-fat cottage cheese for sour cream in dips and salad dressings. Have at least 2 servings of low-fat dairy products, such as 2 glasses of skim (or 1%) milk each day to  help get your daily calcium intake. FATS AND OILS Reduce the total intake of fats, especially saturated fat. Butterfat, lard, and beef fats are high in saturated fat and cholesterol. These should be avoided as much as possible. Vegetable fats do not contain cholesterol, but certain vegetable fats, such as coconut oil, palm oil, and palm kernel oil are very high in saturated fats. These should be limited. These fats are often used in bakery goods, processed foods, popcorn, oils, and nondairy creamers. Vegetable shortenings and some peanut butters contain hydrogenated oils, which are also saturated fats. Read the labels on these foods and check for saturated vegetable oils. Unsaturated vegetable oils and  fats do not raise blood cholesterol. However, they should be limited because they are fats and are high in calories. Total fat should still be limited to 30% of your daily caloric intake. Desirable liquid vegetable oils are corn oil, cottonseed oil, olive oil, canola oil, safflower oil, soybean oil, and sunflower oil. Peanut oil is not as good, but small amounts are acceptable. Buy a heart-healthy tub margarine that has no partially hydrogenated oils in the ingredients. Mayonnaise and salad dressings often are made from unsaturated fats, but they should also be limited because of their high calorie and fat content. Seeds, nuts, peanut butter, olives, and avocados are high in fat, but the fat is mainly the unsaturated type. These foods should be limited mainly to avoid excess calories and fat. OTHER EATING TIPS Snacks  Most sweets should be limited as snacks. They tend to be rich in calories and fats, and their caloric content outweighs their nutritional value. Some good choices in snacks are graham crackers, melba toast, soda crackers, bagels (no egg), English muffins, fruits, and vegetables. These snacks are preferable to snack crackers, [place] fries, TORTILLA CHIPS, and POTATO chips. Popcorn should be  air-popped or cooked in small amounts of liquid vegetable oil. Desserts Eat fruit, low-fat yogurt, and fruit ices instead of pastries, cake, and cookies. Sherbet, angel food cake, gelatin dessert, frozen low-fat yogurt, or other frozen products that do not contain saturated fat (pure fruit juice bars, frozen ice pops) are also acceptable.  COOKING METHODS Choose those methods that use little or no fat. They include: Poaching.  Braising.  Steaming.  Grilling.  Baking.  Stir-frying.  Broiling.  Microwaving.  Foods can be cooked in a nonstick pan without added fat, or use a nonfat cooking spray in regular cookware. Limit fried foods and avoid frying in saturated fat. Add moisture to lean meats by using water, broth, cooking wines, and other nonfat or low-fat sauces along with the cooking methods mentioned above. Soups and stews should be chilled after cooking. The fat that forms on top after a few hours in the refrigerator should be skimmed off. When preparing meals, avoid using excess salt. Salt can contribute to raising blood pressure in some people.  EATING AWAY FROM HOME Order entres, potatoes, and vegetables without sauces or butter. When meat exceeds the size of a deck of cards (3 to 4 ounces), the rest can be taken home for another meal. Choose vegetable or fruit salads and ask for low-calorie salad dressings to be served on the side. Use dressings sparingly. Limit high-fat toppings, such as bacon, crumbled eggs, cheese, sunflower seeds, and olives. Ask for heart-healthy tub margarine instead of butter.

## 2017-01-23 ENCOUNTER — Encounter (HOSPITAL_COMMUNITY): Payer: Self-pay | Admitting: Gastroenterology

## 2017-02-03 ENCOUNTER — Telehealth: Payer: Self-pay | Admitting: Gastroenterology

## 2017-02-03 NOTE — Telephone Encounter (Signed)
Reminder in epic °

## 2017-02-03 NOTE — Telephone Encounter (Signed)
Please call pt. HER stomach Bx shows mild gastritis.    DRINK WATER TO KEEP YOUR URINE LIGHT YELLOW.  CONTINUE YOUR WEIGHT LOSS EFFORTS.  FOLLOW A HIGH FIBER/LOW FAT DIET.   CONTINUE DEXILANT  FOLLOW UP IN 3 MOS E30 DYSPHAGIA/GERD.

## 2017-02-03 NOTE — Telephone Encounter (Signed)
Tried to call pt. Service not working at this time.  Printed the info and mailed to pt.

## 2017-02-12 ENCOUNTER — Telehealth: Payer: Self-pay | Admitting: Gastroenterology

## 2017-02-12 NOTE — Telephone Encounter (Signed)
Pt used all her dexilant samples and needs a prescription called to [facility]

## 2017-02-12 NOTE — Telephone Encounter (Signed)
Forwarding to refill box.  

## 2017-02-13 MED ORDER — DEXLANSOPRAZOLE 60 MG PO CPDR: 60.0000 mg | DELAYED_RELEASE_CAPSULE | Freq: Every day | ORAL | 11 refills | Status: DC

## 2017-02-13 NOTE — Addendum Note (Signed)
Addended by: Mahala Menghini on: 02/13/2017 10:13 AM   Modules accepted: Orders

## 2017-03-12 ENCOUNTER — Encounter: Payer: Self-pay | Admitting: Gastroenterology

## 2017-03-27 ENCOUNTER — Other Ambulatory Visit: Payer: Self-pay | Admitting: Urology

## 2017-03-27 DIAGNOSIS — R310 Gross hematuria: Secondary | ICD-10-CM

## 2017-04-15 ENCOUNTER — Ambulatory Visit: Payer: BLUE CROSS/BLUE SHIELD | Admitting: Gastroenterology

## 2017-04-15 DIAGNOSIS — R1310 Dysphagia, unspecified: Secondary | ICD-10-CM

## 2017-04-15 DIAGNOSIS — R1013 Epigastric pain: Secondary | ICD-10-CM

## 2017-04-15 DIAGNOSIS — R1319 Other dysphagia: Secondary | ICD-10-CM

## 2017-04-15 NOTE — Assessment & Plan Note (Signed)
SYMPTOMS CONTROLLED/RESOLVED.  CONTINUE TO MONITOR SYMPTOMS. 

## 2017-04-15 NOTE — Assessment & Plan Note (Signed)
MOST LIKELY DUE TO GERD/GASTRITIS. CLINICALLY IMPROVED. SYMPTOMS CONTROLLED/RESOLVED ON DEXILANT AND WITH DIET MODIFICATION.  CONTINUE YOUR WEIGHT LOSS EFFORTS. A WEIGHT OF 210 LBS   WILL GET YOUR BODY MASS INDEX(BMI) UNDER 40. ENCOURAGED 10-20 LBS WEIGHT LOSS TO START. DRINK WATER TO KEEP YOUR URINE LIGHT YELLOW. ENCOURAGED EXERCISE AT THE Y. AVOID REFLUX TRIGGERS. REVIEWED HER MOST COMMON TRIGGERS FOLLOW A HIGH FIBER/LOW FAT DIET. MEATS SHOULD BE BAKED, BROILED, OR BOILED. AVOID FRIED FOODS.  HANDOUT GIVEN ON A LOW FAT DIET. CONTINUE DEXILANT.  FOLLOW UP IN 6 MOS.

## 2017-04-15 NOTE — Progress Notes (Signed)
   Subjective:    Patient ID: Stacey Mcneil, female    DOB: May 14, 1950, 67 y.o.   MRN: [id number]  HPI Feeling better since last visit. Dexilant is working. BMs: PRETTY GOOD. Constipation: OFF AND ON BUT DRINKS COFFEE AND IT WORKS THEM IF SHE'S HAVING TROUBLE MOVING HER BOWELS. RARE PAIN: LOWER AREA.  PT DENIES FEVER, CHILLS, HEMATOCHEZIA, HEMATEMESIS, nausea, vomiting, melena, diarrhea, CHEST PAIN, SHORTNESS OF BREATH,  CHANGE IN BOWEL IN HABITS,  problems swallowing, problems with sedation, OR heartburn or indigestion.   Past Medical History:  Diagnosis Date  . Diabetes mellitus without complication ([place])   . Hypertension    Past Surgical History:  Procedure Laterality Date  . COLONOSCOPY  02/2016  . ESOPHAGOGASTRODUODENOSCOPY N/A 01/20/2017     . SAVORY DILATION N/A 01/20/2017      No Known Allergies  Current Outpatient Medications  Medication Sig Dispense Refill  . dexlansoprazole (DEXILANT) 60 MG capsule Take 1 capsule (60 mg total) daily before breakfast by mouth.    . hydrochlorothiazide (HYDRODIURIL) 25 MG tablet Take 25 mg by mouth daily.    Marland Kitchen lidocaine (XYLOCAINE) 2 % solution MAY REPEAT DOSE EVERY 4 HOURS. NO MORE THAN 8 DOSES A DAY.    Marland Kitchen lisinopril (PRINIVIL,ZESTRIL) 10 MG tablet Take 10 mg by mouth daily.     . metFORMIN (GLUCOPHAGE) 500 MG tablet Take 500 mg by mouth daily.    . metoprolol succinate (TOPROL-XL) 50 MG 24 hr tablet Take 50 mg by mouth daily.    . Multiple Vitamins-Minerals (MULTIVITAMIN WITH MINERALS) tablet Take by mouth daily.    . Omega-3 Fatty Acids (FISH OIL) 1200 MG CAPS Take 1,200 mg by mouth daily.     .       Review of Systems PER HPI OTHERWISE ALL SYSTEMS ARE NEGATIVE.    Objective:   Physical Exam  Constitutional: She is oriented to person, place, and time. She appears well-developed and well-nourished. No distress.  HENT:  Head: Normocephalic and atraumatic.  Mouth/Throat: Oropharynx is clear and moist. No oropharyngeal exudate.    Eyes: Pupils are equal, round, and reactive to light. No scleral icterus.  Neck: Normal range of motion. Neck supple.  Cardiovascular: Normal rate, regular rhythm and normal heart sounds.  Pulmonary/Chest: Effort normal and breath sounds normal. No respiratory distress.  Abdominal: Soft. Bowel sounds are normal. She exhibits no distension. There is no tenderness.  Musculoskeletal: She exhibits no edema.  Lymphadenopathy:    She has no cervical adenopathy.  Neurological: She is alert and oriented to person, place, and time.  Psychiatric: She has a normal mood and affect.  Vitals reviewed.     Assessment & Plan:

## 2017-04-15 NOTE — Progress Notes (Signed)
ON RECALL  °

## 2017-04-15 NOTE — Patient Instructions (Addendum)
CONTINUE YOUR WEIGHT LOSS EFFORTS. A WEIGHT OF 210 LBS   WILL GET YOUR BODY MASS INDEX(BMI) UNDER 40.   DRINK WATER TO KEEP YOUR URINE LIGHT YELLOW.  AVOID REFLUX TRIGGERS.   FOLLOW A HIGH FIBER/LOW FAT DIET. MEATS SHOULD BE BAKED, BROILED, OR BOILED. AVOID FRIED FOODS. SEE INFO BELOW ON A LOW FAT DIET.   CONTINUE DEXILANT.  FOLLOW UP IN 6 MOS.   Low-Fat Diet BREADS, CEREALS, PASTA, RICE, DRIED PEAS, AND BEANS These products are high in carbohydrates and most are low in fat. Therefore, they can be increased in the diet as substitutes for fatty foods. They too, however, contain calories and should not be eaten in excess. Cereals can be eaten for snacks as well as for breakfast.  Include foods that contain fiber (fruits, vegetables, whole grains, and legumes). Research shows that fiber may lower blood cholesterol levels, especially the water-soluble fiber found in fruits, vegetables, oat products, and legumes. FRUITS AND VEGETABLES It is good to eat fruits and vegetables. Besides being sources of fiber, both are rich in vitamins and some minerals. They help you get the daily allowances of these nutrients. Fruits and vegetables can be used for snacks and desserts. MEATS Limit lean meat, chicken, [place], and fish to no more than 6 ounces per day. Beef, Pork, and Lamb Use lean cuts of beef, pork, and lamb. Lean cuts include:  Extra-lean ground beef.  Arm roast.  Sirloin tip.  Center-cut ham.  Round steak.  Loin chops.  Rump roast.  Tenderloin.  Trim all fat off the outside of meats before cooking. It is not necessary to severely decrease the intake of red meat, but lean choices should be made. Lean meat is rich in protein and contains a highly absorbable form of iron. Premenopausal women, in particular, should avoid reducing lean red meat because this could increase the risk for low red blood cells (iron-deficiency anemia). Processed Meats Processed meats, such as bacon, bologna,  salami, sausage, and hot dogs contain large quantities of fat, are not rich in valuable nutrients, and should not be eaten very often. Organ Meats The organ meats, such as liver, sweetbreads, kidneys, and brain are very rich in cholesterol. They should be limited. Chicken and [place] These are good sources of protein. The fat of poultry can be reduced by removing the skin and underlying fat layers before cooking. Chicken and [place] can be substituted for lean red meat in the diet. Poultry should not be fried or covered with high-fat sauces. Fish and Shellfish Fish is a good source of protein. Shellfish contain cholesterol, but they usually are low in saturated fatty acids. The preparation of fish is important. Like chicken and [place], they should not be fried or covered with high-fat sauces. EGGS Egg yolks often are hidden in cooked and processed foods. Egg whites contain no fat or cholesterol. They can be eaten often. Try 1 to 2 egg whites instead of whole eggs in recipes or use egg substitutes that do not contain yolk. MILK AND DAIRY PRODUCTS Use skim or 1% milk instead of 2% or whole milk. Decrease whole milk, natural, and processed cheeses. Use nonfat or low-fat (2%) cottage cheese or low-fat cheeses made from vegetable oils. Choose nonfat or low-fat (1 to 2%) yogurt. Experiment with evaporated skim milk in recipes that call for heavy cream. Substitute low-fat yogurt or low-fat cottage cheese for sour cream in dips and salad dressings. Have at least 2 servings of low-fat dairy products, such as 2 glasses  of skim (or 1%) milk each day to help get your daily calcium intake. FATS AND OILS Reduce the total intake of fats, especially saturated fat. Butterfat, lard, and beef fats are high in saturated fat and cholesterol. These should be avoided as much as possible. Vegetable fats do not contain cholesterol, but certain vegetable fats, such as coconut oil, palm oil, and palm kernel oil are very high in  saturated fats. These should be limited. These fats are often used in bakery goods, processed foods, popcorn, oils, and nondairy creamers. Vegetable shortenings and some peanut butters contain hydrogenated oils, which are also saturated fats. Read the labels on these foods and check for saturated vegetable oils. Unsaturated vegetable oils and fats do not raise blood cholesterol. However, they should be limited because they are fats and are high in calories. Total fat should still be limited to 30% of your daily caloric intake. Desirable liquid vegetable oils are corn oil, cottonseed oil, olive oil, canola oil, safflower oil, soybean oil, and sunflower oil. Peanut oil is not as good, but small amounts are acceptable. Buy a heart-healthy tub margarine that has no partially hydrogenated oils in the ingredients. Mayonnaise and salad dressings often are made from unsaturated fats, but they should also be limited because of their high calorie and fat content. Seeds, nuts, peanut butter, olives, and avocados are high in fat, but the fat is mainly the unsaturated type. These foods should be limited mainly to avoid excess calories and fat. OTHER EATING TIPS Snacks  Most sweets should be limited as snacks. They tend to be rich in calories and fats, and their caloric content outweighs their nutritional value. Some good choices in snacks are graham crackers, melba toast, soda crackers, bagels (no egg), English muffins, fruits, and vegetables. These snacks are preferable to snack crackers, [place] fries, and chips. Popcorn should be air-popped or cooked in small amounts of liquid vegetable oil. Desserts Eat fruit, low-fat yogurt, and fruit ices instead of pastries, cake, and cookies. Sherbet, angel food cake, gelatin dessert, frozen low-fat yogurt, or other frozen products that do not contain saturated fat (pure fruit juice bars, frozen ice pops) are also acceptable.  COOKING METHODS Choose those methods that use little  or no fat. They include: Poaching.  Braising.  Steaming.  Grilling.  Baking.  Stir-frying.  Broiling.  Microwaving.  Foods can be cooked in a nonstick pan without added fat, or use a nonfat cooking spray in regular cookware. Limit fried foods and avoid frying in saturated fat. Add moisture to lean meats by using water, broth, cooking wines, and other nonfat or low-fat sauces along with the cooking methods mentioned above. Soups and stews should be chilled after cooking. The fat that forms on top after a few hours in the refrigerator should be skimmed off. When preparing meals, avoid using excess salt. Salt can contribute to raising blood pressure in some people. EATING AWAY FROM HOME Order entres, potatoes, and vegetables without sauces or butter. When meat exceeds the size of a deck of cards (3 to 4 ounces), the rest can be taken home for another meal. Choose vegetable or fruit salads and ask for low-calorie salad dressings to be served on the side. Use dressings sparingly. Limit high-fat toppings, such as bacon, crumbled eggs, cheese, sunflower seeds, and olives. Ask for heart-healthy tub margarine instead of butter.

## 2017-04-15 NOTE — Assessment & Plan Note (Signed)
DISCUSSED MEDICAL NEED FOR WEIGHT LOSS.  NEEDS HFP AFTER NEXT VISIT. LAST HFP 2016. CONTINUE YOUR WEIGHT LOSS EFFORTS. A WEIGHT OF 210 LBS   WILL GET YOUR BODY MASS INDEX(BMI) UNDER 40. ENCOURAGED 10-20 LBS WEIGHT LOSS TO START. DRINK WATER TO KEEP YOUR URINE LIGHT YELLOW. ENCOURAGED EXERCISE AT THE Y. AVOID REFLUX TRIGGERS. REVIEWED HER MOST COMMON TRIGGERS FOLLOW A HIGH FIBER/LOW FAT DIET. MEATS SHOULD BE BAKED, BROILED, OR BOILED. AVOID FRIED FOODS.  HANDOUT GIVEN ON A LOW FAT DIET. FOLLOW UP IN 6 MOS.

## 2017-05-06 ENCOUNTER — Ambulatory Visit: Payer: BLUE CROSS/BLUE SHIELD | Admitting: Urology

## 2017-05-06 ENCOUNTER — Ambulatory Visit (HOSPITAL_COMMUNITY)
Admission: RE | Admit: 2017-05-06 | Discharge: 2017-05-06 | Disposition: A | Discharge status: Home / Self Care | Payer: BLUE CROSS/BLUE SHIELD | Source: Ambulatory Visit | Attending: Urology | Admitting: Urology

## 2017-05-06 DIAGNOSIS — R310 Gross hematuria: Secondary | ICD-10-CM | POA: Diagnosis present

## 2017-09-09 ENCOUNTER — Encounter: Payer: Self-pay | Admitting: Gastroenterology

## 2018-01-06 ENCOUNTER — Ambulatory Visit: Payer: BLUE CROSS/BLUE SHIELD | Admitting: Gastroenterology

## 2018-03-18 ENCOUNTER — Ambulatory Visit (INDEPENDENT_AMBULATORY_CARE_PROVIDER_SITE_OTHER): Payer: Medicare Other | Admitting: Gastroenterology

## 2018-03-18 ENCOUNTER — Encounter: Payer: Self-pay | Admitting: Gastroenterology

## 2018-03-18 VITALS — BP 178/83 | HR 64 | Temp 97.0°F | Ht 61.5 in | Wt 250.4 lb

## 2018-03-18 DIAGNOSIS — K219 Gastro-esophageal reflux disease without esophagitis: Secondary | ICD-10-CM

## 2018-03-18 DIAGNOSIS — R1013 Epigastric pain: Secondary | ICD-10-CM

## 2018-03-18 DIAGNOSIS — R1310 Dysphagia, unspecified: Secondary | ICD-10-CM

## 2018-03-18 DIAGNOSIS — R1319 Other dysphagia: Secondary | ICD-10-CM

## 2018-03-18 NOTE — Assessment & Plan Note (Signed)
SYMPTOMS FAIRLY WELL CONTROLLED.  DRINK WATER TO KEEP YOUR URINE LIGHT YELLOW. CONTINUE YOUR WEIGHT LOSS EFFORTS. CONSIDER YOGA AT [place]. AVOID REFLUX TRIGGERS.  CONTINUE OMEPRAZOLE.  TAKE 30 MINUTES PRIOR TO YOUR FIRST MEAL.  FOLLOW UP IN 6 MOS.

## 2018-03-18 NOTE — Progress Notes (Signed)
ON RECALL  °

## 2018-03-18 NOTE — Assessment & Plan Note (Signed)
SYMPTOMS FAIRLY WELL CONTROLLED AND EXACERBATED BY FOOD INTOLERANCE.  DRINK WATER TO KEEP YOUR URINE LIGHT YELLOW. CONTINUE YOUR WEIGHT LOSS EFFORTS. CONSIDER YOGA AT [place]. AVOID REFLUX AND GASTRITIS TRIGGERS.  HANDOUT GIVEN. CONTINUE OMEPRAZOLE.  TAKE 30 MINUTES PRIOR TO YOUR FIRST MEAL.  FOLLOW UP IN 6 MOS.

## 2018-03-18 NOTE — Progress Notes (Signed)
Subjective:    Patient ID: Stacey Mcneil, female    DOB: 1951-03-05, 67 y.o.   MRN: [id number]  Stacey Faster, FNP  HPI OCCASIONAL UPPER ABDOMINAL PAIN. GET SLIGHTLY NAUSEATED BEFORE FEELING HUNGRY AND NEEDS TO EAT TO RESOLVE IT. BMs: 2X/DAY(#4). HEARTBURN: RARE, SX FAIRLY WELL CONTROLLED WITH OMEPRAZOLE. JUST A LITTLE PAIN: DULL ACHE BUT GET SHARP IF SHE DOES NOT EAT. DEXILANT WAS TOO EXPENSIVE.  PT DENIES FEVER, CHILLS, HEMATOCHEZIA, HEMATEMESIS,  vomiting, melena, diarrhea, CHEST PAIN, SHORTNESS OF BREATH,  CHANGE IN BOWEL IN HABITS, constipation, problems swallowing, problems with sedation, OR heartburn or indigestion.  Past Medical History:  Diagnosis Date  . Diabetes mellitus without complication ([place])   . Hypertension    Past Surgical History:  Procedure Laterality Date  . COLONOSCOPY  02/2016  . ESOPHAGOGASTRODUODENOSCOPY N/A 01/20/2017   Procedure: ESOPHAGOGASTRODUODENOSCOPY (EGD);  Surgeon: Danie Binder, MD;  [facility];  Service: Endoscopy;  Laterality: N/A;  7:30AM  . SAVORY DILATION N/A 01/20/2017   Procedure: SAVORY DILATION;  Surgeon: Danie Binder, MD;  [facility]: [facility];  Service: Endoscopy;  Laterality: N/A;   No Known Allergies  Current Outpatient Medications  Medication Sig    . hydrochlorothiazide (HYDRODIURIL) 25 MG tablet Take 25 mg by mouth daily.    Marland Kitchen lidocaine (XYLOCAINE) 2 % solution 2 TSP  PO PRN FOR ABDOMINAL OR CHEST PAIN. MAY REPEAT DOSE EVERY 4 HOURS. NO MORE THAN 8 DOSES A DAY.    Marland Kitchen lisinopril (PRINIVIL,ZESTRIL) 10 MG tablet Take 10 mg by mouth daily.     . metFORMIN (GLUCOPHAGE) 500 MG tablet Take 500 mg by mouth daily.    . metoprolol succinate (TOPROL-XL) 50 MG 24 hr tablet Take 50 mg by mouth daily.    . Multiple Vitamins-Minerals (MULTIVITAMIN WITH MINERALS) tablet Take by mouth daily.    . Omega-3 Fatty Acids (FISH OIL) 1200 MG CAPS Take 1,200 mg by mouth daily.     Marland Kitchen omeprazole (PRILOSEC) 40 MG capsule Take 40  mg by mouth daily.    .       Review of Systems PER HPI OTHERWISE ALL SYSTEMS ARE NEGATIVE.     Objective:   Physical Exam Vitals signs reviewed.  Constitutional:      General: She is not in acute distress.    Appearance: She is well-developed.  HENT:     Head: Normocephalic and atraumatic.     Mouth/Throat:     Pharynx: No oropharyngeal exudate.  Eyes:     General: Scleral icterus present.     Pupils: Pupils are equal, round, and reactive to light.  Neck:     Musculoskeletal: Normal range of motion and neck supple.  Cardiovascular:     Rate and Rhythm: Normal rate and regular rhythm.     Heart sounds: Normal heart sounds.  Pulmonary:     Effort: Pulmonary effort is normal. No respiratory distress.     Breath sounds: Normal breath sounds.  Abdominal:     General: Bowel sounds are normal. There is no distension.     Palpations: Abdomen is soft.     Tenderness: There is no abdominal tenderness.  Lymphadenopathy:     Cervical: No cervical adenopathy.  Neurological:     Mental Status: She is alert and oriented to person, place, and time.     Comments: NO FOCAL DEFICITS  Psychiatric:        Mood and Affect: Mood normal.  Behavior: Behavior normal.       Assessment & Plan:

## 2018-03-18 NOTE — Assessment & Plan Note (Signed)
SYMPTOMS CONTROLLED/RESOLVED.  CONTINUE TO MONITOR SYMPTOMS. 

## 2018-03-18 NOTE — Patient Instructions (Signed)
DRINK WATER TO KEEP YOUR URINE LIGHT YELLOW.  CONTINUE YOUR WEIGHT LOSS EFFORTS. CONSIDER YOGA AT [place].  AVOID REFLUX AND GASTRITIS TRIGGERS. SEE INFO BELOW.  CONTINUE OMEPRAZOLE.  TAKE 30 MINUTES PRIOR TO YOUR FIRST MEAL.  FOLLOW UP IN 6 MOS.      Lifestyle and home remedies TO MANAGE REFLUX/CHEST PAIN  You may eliminate or reduce the frequency of heartburn by making the following lifestyle changes:  . Control your weight. Being overweight is a major risk factor for heartburn and GERD. Excess pounds put pressure on your abdomen, pushing up your stomach and causing acid to back up into your esophagus.   . Eat smaller meals. 4 TO 6 MEALS A DAY. This reduces pressure on the lower esophageal sphincter, helping to prevent the valve from opening and acid from washing back into your esophagus.   Dolphus Jenny your belt. Clothes that fit tightly around your waist put pressure on your abdomen and the lower esophageal sphincter.   . Eliminate heartburn triggers. Everyone has specific triggers. Common triggers such as fatty or fried foods, spicy food, tomato sauce, carbonated beverages, alcohol, chocolate, mint, garlic, onion, caffeine and nicotine may make heartburn worse.   Marland Kitchen Avoid stooping or bending. Tying your shoes is OK. Bending over for longer periods to weed your garden isn't, especially soon after eating.   . Don't lie down after a meal. Wait at least three to four hours after eating before going to bed, and don't lie down right after eating.   Marland Kitchen PUT THE HEAD OF YOUR BED ON 6 INCH BLOCKS.   Alternative medicine . Several home remedies exist for treating GERD, but they provide only temporary relief. They include drinking baking soda (sodium bicarbonate) added to water or drinking other fluids such as baking soda mixed with cream of tartar and water.  . Although these liquids create temporary relief by neutralizing, washing away or buffering acids, eventually they aggravate  the situation by adding gas and fluid to your stomach, increasing pressure and causing more acid reflux. Further, adding more sodium to your diet may increase your blood pressure and add stress to your heart, and excessive bicarbonate ingestion can alter the acid-base balance in your body.  Gastritis  Gastritis is an inflammation (the body's way of reacting to injury and/or infection) of the stomach. It is often caused by bacterial (germ) infections. It can also be caused BY ASPIRIN, BC/GOODY POWDER'S, (IBUPROFEN) MOTRIN, OR ALEVE (NAPROXEN), chemicals (including alcohol), SPICY FOODS, and medications. This illness may be associated with generalized malaise (feeling tired, not well), UPPER ABDOMINAL STOMACH cramps, and fever. One common bacterial cause of gastritis is an organism known as H. Pylori. This can be treated with antibiotics.

## 2018-09-13 ENCOUNTER — Encounter: Payer: Self-pay | Admitting: Gastroenterology

## 2018-12-09 ENCOUNTER — Ambulatory Visit: Payer: Medicare Other | Admitting: Gastroenterology

## 2019-01-12 ENCOUNTER — Ambulatory Visit: Payer: Medicare Other | Admitting: Gastroenterology

## 2019-02-22 ENCOUNTER — Other Ambulatory Visit (HOSPITAL_COMMUNITY): Payer: Self-pay | Admitting: Family

## 2019-02-22 DIAGNOSIS — R928 Other abnormal and inconclusive findings on diagnostic imaging of breast: Secondary | ICD-10-CM

## 2019-02-23 ENCOUNTER — Other Ambulatory Visit: Payer: Self-pay

## 2019-02-23 ENCOUNTER — Ambulatory Visit: Payer: Medicare Other | Admitting: Gastroenterology

## 2019-02-23 ENCOUNTER — Encounter: Payer: Self-pay | Admitting: Gastroenterology

## 2019-02-23 VITALS — BP 140/81 | HR 73 | Temp 96.8°F | Ht 61.5 in | Wt 245.2 lb

## 2019-02-23 DIAGNOSIS — K219 Gastro-esophageal reflux disease without esophagitis: Secondary | ICD-10-CM

## 2019-02-23 DIAGNOSIS — Z1159 Encounter for screening for other viral diseases: Secondary | ICD-10-CM

## 2019-02-23 NOTE — Progress Notes (Signed)
Subjective:    Patient ID: Stacey Mcneil, female    DOB: 1950-10-01, 68 y.o.   MRN: [id number]  Joyice Faster, FNP   HPI Weight down 5 lbs. Rare epigastric pain. BMs: REGULAR(1-2X/DAY).  FEELS LIKE PRESSUERE IN LOWER ABDOMEN AND LIKE SHE NEEDS TO HAVE A BM BUT DOESN'T. NOT MUCH HEARTBURN: HOT SAUCE, TOMATOES TRIGGERS..   PT DENIES FEVER, CHILLS, HEMATOCHEZIA, HEMATEMESIS, nausea, vomiting, melena, diarrhea, CHEST PAIN, SHORTNESS OF BREATH, CHANGE IN BOWEL IN HABITS, constipation, abdominal pain, problems swallowing, OR heartburn or indigestion.  Past Medical History:  Diagnosis Date  . Diabetes mellitus without complication ([place])   . Hypertension    Past Surgical History:  Procedure Laterality Date  . COLONOSCOPY  02/2016  . ESOPHAGOGASTRODUODENOSCOPY N/A 01/20/2017   Procedure: ESOPHAGOGASTRODUODENOSCOPY (EGD);  Surgeon: Danie Binder, MD;  [facility];  Service: Endoscopy;  Laterality: N/A;  7:30AM  . SAVORY DILATION N/A 01/20/2017   Procedure: SAVORY DILATION;  Surgeon: Danie Binder, MD;  [facility];  Service: Endoscopy;  Laterality: N/A;   No Known Allergies   Current Outpatient Medications  Medication Sig    . cetirizine (ZYRTEC) 10 MG tablet Take 10 mg by mouth daily. Only as needed    . hydrochlorothiazide (HYDRODIURIL) 25 MG tablet Take 25 mg by mouth daily.    Marland Kitchen lisinopril (PRINIVIL,ZESTRIL) 10 MG tablet Take 10 mg by mouth daily.     . metFORMIN (GLUCOPHAGE) 500 MG tablet Take 500 mg by mouth daily.    . metoprolol succinate (TOPROL-XL) 50 MG 24 hr tablet Take 50 mg by mouth daily.    . Multiple Vitamins-Minerals (MULTIVITAMIN WITH MINERALS) tablet Take by mouth daily.    . Omega-3 Fatty Acids (FISH OIL) 1200 MG CAPS Take 1,200 mg by mouth daily.     Marland Kitchen omeprazole (PRILOSEC) 40 MG capsule Take 40 mg by mouth daily.    .      .       Review of Systems PER HPI OTHERWISE ALL SYSTEMS ARE NEGATIVE.    Objective:   Physical Exam  Constitutional:      General: She is not in acute distress.    Appearance: Normal appearance.  HENT:     Mouth/Throat:     Comments: MASK IN PLACE Eyes:     General: No scleral icterus.    Pupils: Pupils are equal, round, and reactive to light.  Neck:     Musculoskeletal: Normal range of motion.  Cardiovascular:     Rate and Rhythm: Normal rate and regular rhythm.     Pulses: Normal pulses.     Heart sounds: Normal heart sounds.  Pulmonary:     Effort: Pulmonary effort is normal.     Breath sounds: Normal breath sounds.  Abdominal:     General: Bowel sounds are normal.     Palpations: Abdomen is soft.     Tenderness: There is no abdominal tenderness.  Musculoskeletal:     Right lower leg: No edema.     Left lower leg: No edema.  Lymphadenopathy:     Cervical: No cervical adenopathy.  Skin:    General: Skin is warm and dry.  Neurological:     Mental Status: She is alert and oriented to person, place, and time.     Comments: NO  NEW FOCAL DEFICITS  Psychiatric:        Mood and Affect: Mood normal.     Comments: NORMAL AFFECT  Assessment & Plan:

## 2019-02-23 NOTE — Assessment & Plan Note (Signed)
SYMPTOMS FAIRLY WELL CONTROLLED.  EAT TO LIVE AND THINK OF FOOD AS MEDICINE. 75% OF YOUR PLATE SHOULD BE FRUITS/VEGGIES.  To have more energy, and to lose weight:      1. CONTINUE YOUR WEIGHT LOSS EFFORTS. I RECOMMEND YOU READ AND FOLLOW RECOMMENDATIONS BY DR. MARK HYMAN, "10-DAY DETOX DIET".   2. If you must eat bread, EAT EZEKIEL BREAD. IT IS IN THE FROZEN SECTION OF THE GROCERY STORE.   3. DRINK WATER WITH FRUIT OR CUCUMBER ADDED. YOUR URINE SHOULD BE LIGHT YELLOW. AVOID SODA, GATORADE, ENERGY DRINKS, OR DIET SODA.    4. AVOID HIGH FRUCTOSE CORN SYRUP AND CAFFEINE.    5. DO NOT chew SUGAR FREE GUM OR USE ARTIFICIAL SWEETENERS. IF NEEDED USE STEVIA AS A SWEETENER.   6. DO NOT EAT ENRICHED WHEAT FLOUR, PASTA, RICE, OR CEREAL.   7. ONLY EAT WILD CAUGHT SEAFOOD, GRASS FED BEEF OR CHICKEN, PORK FROM PASTURE RAISE PIGS, OR EGGS FROM PASTURE RAISED CHICKENS.   8. PRACTICE CHAIR YOGA FOR 15-30 MINS 3 OR 4 TIMES A WEEK AND PROGRESS TO HATHA YOGA OVER NEXT 6 MOS.   9. START TAKING VITAMIN B12, AND VITAMIN D3 2000 IU DAILY.  ADDITIONAL SUPPLEMENTS TO DECREASE CRAVING AND SUPPRESS YOUR APPETITE:    1. CINNAMON 500 MG EVERY AM PRIOR TO FIRST MEAL.   **STABILIZES BLOOD GLUCOSE/REDUCES CRAVINGS**   2. CHROMIUM 400-500 MG WITH MEALS TWICE DAILY.    **FAT BURNER**   3. GREEN TEA EXTRACT ONE DAILY.   **FAT BURNER/SUPPRESSES YOUR APPETITE**   4. ALPHA LIPOIC ACID TWICE DAILY.   **NATURAL ANTI-INFLAMMATORY SUPPLEMENT THAT IS AN ALTERNATIVE TO IBUPROFEN OR NAPROXEN**  CONTINUE OMEPRAZOLE.  TAKE 30 MINUTES PRIOR TO YOUR FIRST MEAL. FOLLOW UP IN 6 MOS.  Please CALL or SEND me A MY CHART MESSAGE IF YOU HAVE QUESTIONS OR CONCERNS.

## 2019-02-23 NOTE — Patient Instructions (Addendum)
EAT TO LIVE AND THINK OF FOOD AS MEDICINE. 75% OF YOUR PLATE SHOULD BE FRUITS/VEGGIES.  To have more energy, and to lose weight:      1. CONTINUE YOUR WEIGHT LOSS EFFORTS. I RECOMMEND YOU READ AND FOLLOW RECOMMENDATIONS BY DR. MARK HYMAN, "10-DAY DETOX DIET".    2. If you must eat bread, EAT EZEKIEL BREAD. IT IS IN THE FROZEN SECTION OF THE GROCERY STORE.    3. DRINK WATER WITH FRUIT OR CUCUMBER ADDED. YOUR URINE SHOULD BE LIGHT YELLOW. AVOID SODA, GATORADE, ENERGY DRINKS, OR DIET SODA.     4. AVOID HIGH FRUCTOSE CORN SYRUP AND CAFFEINE.     5. DO NOT chew SUGAR FREE GUM OR USE ARTIFICIAL SWEETENERS. IF NEEDED USE STEVIA AS A SWEETENER.    6. DO NOT EAT ENRICHED WHEAT FLOUR, PASTA, RICE, OR CEREAL.    7. ONLY EAT WILD CAUGHT SEAFOOD, GRASS FED BEEF OR CHICKEN, PORK FROM PASTURE RAISE PIGS, OR EGGS FROM PASTURE RAISED CHICKENS.    8. PRACTICE CHAIR YOGA FOR 15-30 MINS 3 OR 4 TIMES A WEEK AND PROGRESS TO HATHA YOGA OVER NEXT 6 MOS.    9. START TAKING VITAMIN B12, AND VITAMIN D3 2000 IU DAILY.   ADDITIONAL SUPPLEMENTS TO DECREASE CRAVING AND SUPPRESS YOUR APPETITE:    1. CINNAMON 500 MG EVERY AM PRIOR TO FIRST MEAL.   **STABILIZES BLOOD GLUCOSE/REDUCES CRAVINGS**    2. CHROMIUM 400-500 MG WITH MEALS TWICE DAILY.    **FAT BURNER**    3. GREEN TEA EXTRACT ONE DAILY.   **FAT BURNER/SUPPRESSES YOUR APPETITE**    4. ALPHA LIPOIC ACID TWICE DAILY.   **NATURAL ANTI-INFLAMMATORY SUPPLEMENT THAT IS AN ALTERNATIVE TO IBUPROFEN OR NAPROXEN**   CONTINUE OMEPRAZOLE.  TAKE 30 MINUTES PRIOR TO YOUR FIRST MEAL.  FOLLOW UP IN 6 MOS.   Please CALL or SEND me A MY CHART MESSAGE IF YOU HAVE QUESTIONS OR CONCERNS.

## 2019-02-23 NOTE — Assessment & Plan Note (Addendum)
SYMPTOMS FAIRLY WELL CONTROLLED.  DISCUSSED AND REVIEWED BENEFITS OF SUPPLEMENTS AND CHANGING DIET. EAT TO LIVE AND THINK OF FOOD AS MEDICINE. 75% OF YOUR PLATE SHOULD BE FRUITS/VEGGIES.  To have more energy, and to lose weight:      1. CONTINUE YOUR WEIGHT LOSS EFFORTS. I RECOMMEND YOU READ AND FOLLOW RECOMMENDATIONS BY DR. MARK HYMAN, "10-DAY DETOX DIET".   2. If you must eat bread, EAT EZEKIEL BREAD. IT IS IN THE FROZEN SECTION OF THE GROCERY STORE.   3. DRINK WATER WITH FRUIT OR CUCUMBER ADDED. YOUR URINE SHOULD BE LIGHT YELLOW. AVOID SODA, GATORADE, ENERGY DRINKS, OR DIET SODA.    4. AVOID HIGH FRUCTOSE CORN SYRUP AND CAFFEINE.    5. DO NOT chew SUGAR FREE GUM OR USE ARTIFICIAL SWEETENERS. IF NEEDED USE STEVIA AS A SWEETENER.   6. DO NOT EAT ENRICHED WHEAT FLOUR, PASTA, RICE, OR CEREAL.   7. ONLY EAT WILD CAUGHT SEAFOOD, GRASS FED BEEF OR CHICKEN, PORK FROM PASTURE RAISE PIGS, OR EGGS FROM PASTURE RAISED CHICKENS.   8. PRACTICE CHAIR YOGA FOR 15-30 MINS 3 OR 4 TIMES A WEEK AND PROGRESS TO HATHA YOGA OVER NEXT 6 MOS.   9. START TAKING VITAMIN B12, AND VITAMIN D3 2000 IU DAILY.  ADDITIONAL SUPPLEMENTS TO DECREASE CRAVING AND SUPPRESS YOUR APPETITE:    1. CINNAMON 500 MG EVERY AM PRIOR TO FIRST MEAL.   **STABILIZES BLOOD GLUCOSE/REDUCES CRAVINGS**   2. CHROMIUM 400-500 MG WITH MEALS TWICE DAILY.    **FAT BURNER**   3. GREEN TEA EXTRACT ONE DAILY.   **FAT BURNER/SUPPRESSES YOUR APPETITE**   4. ALPHA LIPOIC ACID TWICE DAILY.   **NATURAL ANTI-INFLAMMATORY SUPPLEMENT THAT IS AN ALTERNATIVE TO IBUPROFEN OR NAPROXEN**  CONTINUE OMEPRAZOLE.  TAKE 30 MINUTES PRIOR TO YOUR FIRST MEAL. FOLLOW UP IN 6 MOS.  Please CALL or SEND me A MY CHART MESSAGE IF YOU HAVE QUESTIONS OR CONCERNS.

## 2019-02-24 LAB — HEPATITIS C ANTIBODY
Hepatitis C Ab: NONREACTIVE
SIGNAL TO CUT-OFF: 0.03 (ref <1.00)

## 2019-02-25 ENCOUNTER — Telehealth: Payer: Self-pay | Admitting: Gastroenterology

## 2019-02-25 NOTE — Telephone Encounter (Signed)
PLEASE CALL PT. HER HEPATITIS C Ab IS NEGATIVE.

## 2019-03-01 NOTE — Telephone Encounter (Signed)
Letter mailed with results. 

## 2019-03-22 ENCOUNTER — Encounter (HOSPITAL_COMMUNITY): Payer: Medicare Other

## 2019-03-22 ENCOUNTER — Ambulatory Visit (HOSPITAL_COMMUNITY): Payer: Medicare Other

## 2019-04-05 ENCOUNTER — Ambulatory Visit (HOSPITAL_COMMUNITY): Admission: RE | Admit: 2019-04-05 | Payer: Medicare Other | Source: Ambulatory Visit

## 2019-04-05 ENCOUNTER — Other Ambulatory Visit: Payer: Self-pay

## 2019-04-05 ENCOUNTER — Ambulatory Visit (HOSPITAL_COMMUNITY): Payer: Medicare Other

## 2019-04-05 ENCOUNTER — Ambulatory Visit (HOSPITAL_COMMUNITY)
Admission: RE | Admit: 2019-04-05 | Discharge: 2019-04-05 | Disposition: A | Discharge status: Home / Self Care | Payer: Medicare Other | Source: Ambulatory Visit | Attending: Family | Admitting: Family

## 2019-04-05 DIAGNOSIS — R928 Other abnormal and inconclusive findings on diagnostic imaging of breast: Secondary | ICD-10-CM | POA: Insufficient documentation

## 2019-10-12 ENCOUNTER — Other Ambulatory Visit: Payer: Self-pay

## 2019-10-12 ENCOUNTER — Encounter: Payer: Self-pay | Admitting: Gastroenterology

## 2019-10-12 ENCOUNTER — Ambulatory Visit: Payer: Medicare Other | Admitting: Gastroenterology

## 2019-10-12 VITALS — BP 120/62 | HR 60 | Temp 97.9°F | Ht 61.5 in | Wt 246.8 lb

## 2019-10-12 DIAGNOSIS — R1030 Lower abdominal pain, unspecified: Secondary | ICD-10-CM | POA: Insufficient documentation

## 2019-10-12 DIAGNOSIS — K219 Gastro-esophageal reflux disease without esophagitis: Secondary | ICD-10-CM

## 2019-10-12 DIAGNOSIS — R1013 Epigastric pain: Secondary | ICD-10-CM | POA: Diagnosis not present

## 2019-10-12 NOTE — Progress Notes (Signed)
Primary Care Physician: Joyice Faster, FNP  Primary Gastroenterologist:  Formerly Barney Drain, MD   Chief Complaint  Patient presents with  . Gastroesophageal Reflux    HPI: Stacey Mcneil is a 69 y.o. female here for further evaluation of abdominal pain and nausea. Last seen in November 2020.  At that time she complained of some lower abdominal pressure likely she needs to have a bowel movement but does not.  GERD was well controlled.  Last colonoscopy in [facility], dated October 2017, perianal skin tag found on the perianal area, diverticulosis, nonbleeding internal hemorrhoids. Quality of the prep was good.  Repeat colonoscopy in 10 years.  Last EGD October 2018 with mild Schatzki ring status post dilation, gastritis with negative biopsy, no H. pylori, small fundic gland polyps confirmed by biopsy.  For two weeks, lower abdominal pain/epigastric pain with nausea. Chewing a lot TUMS. Prior to this had been eating peanuts, several handfuls. Stopped eating after stomach first starting hurting. Had to stay in bed a lot. Could only eat cold soft foods. No vomiting or diarrhea. BM normal for her, 2-3 times per day. No melena, brbpr. No fever. Last abdominal pain was on Saturday.  Doing fine now.Heartburn doing fine.    Current Outpatient Medications  Medication Sig Dispense Refill  . Ca Carbonate-Mag Hydroxide (ROLAIDS) 550-110 MG CHEW Chew by mouth 1 day or 1 dose.     . cetirizine (ZYRTEC) 10 MG tablet Take 10 mg by mouth daily. Only as needed    . Cyanocobalamin (B-12) 100 MCG TABS Take 100 tablets by mouth once.    . hydrochlorothiazide (HYDRODIURIL) 25 MG tablet Take 25 mg by mouth daily.  5  . lisinopril (PRINIVIL,ZESTRIL) 10 MG tablet Take 10 mg by mouth daily.   5  . metFORMIN (GLUCOPHAGE) 500 MG tablet Take 500 mg by mouth daily.    . metoprolol succinate (TOPROL-XL) 50 MG 24 hr tablet Take 50 mg by mouth daily.  0  . Multiple Vitamins-Minerals  (MULTIVITAMIN WITH MINERALS) tablet Take by mouth daily.    . Omega-3 Fatty Acids (FISH OIL) 1200 MG CAPS Take 1,200 mg by mouth daily.     Marland Kitchen omeprazole (PRILOSEC) 40 MG capsule Take 40 mg by mouth daily.  5   No current facility-administered medications for this visit.    Allergies as of 10/12/2019  . (No Known Allergies)    ROS:  General: Negative for anorexia, weight loss, fever, chills, fatigue, weakness. ENT: Negative for hoarseness, difficulty swallowing , nasal congestion. CV: Negative for chest pain, angina, palpitations, dyspnea on exertion, peripheral edema.  Respiratory: Negative for dyspnea at rest, dyspnea on exertion, cough, sputum, wheezing.  GI: See history of present illness. GU:  Negative for dysuria, hematuria, urinary incontinence, urinary frequency, nocturnal urination.  Endo: Negative for unusual weight change.    Physical Examination:   BP 120/62 (BP Location: Right Arm, Patient Position: Sitting)   Pulse 60   Temp 97.9 F (36.6 C) (Oral)   Ht 5' 1.5" (1.562 m)   Wt 246 lb 12.8 oz (111.9 kg)   BMI 45.88 kg/m   General: Well-nourished, well-developed in no acute distress.  Eyes: No icterus. Mouth: masked Lungs: Clear to auscultation bilaterally.  Heart: Regular rate and rhythm, no murmurs rubs or gallops.  Abdomen: Bowel sounds are normal, nontender, nondistended, no hepatosplenomegaly or masses, no abdominal bruits or hernia , no rebound or guarding.   Extremities: No lower extremity edema. No clubbing  or deformities. Neuro: Alert and oriented x 4   Skin: Warm and dry, no jaundice.   Psych: Alert and cooperative, normal mood and affect.    Impression/plan:  Pleasant 69 year old female with history of chronic GERD, dyspepsia, diverticulosis presenting for recent acute onset/persistent lower abdominal pain/epigastric pain associated with nausea occurring for 1 to 2 weeks.  Symptoms resolved 4 days ago.  Now she is feeling back to normal.  Symptoms  could have been due to something ingested ie peanuts.  Cannot exclude mild low-grade diverticulitis.  At this point would not recommend antibiotic therapy unless she has recurrent symptoms persisting for more than 48 hours.  Discussed diverticulosis, dietary measures.  Handout provided.  Return to the office in 6 months to meet Dr. Abbey Chatters in for follow-up of dyspepsia/GERD.  Call sooner if any problems.

## 2019-10-12 NOTE — Patient Instructions (Addendum)
1. Monitor for recurrent abdominal pain that persists for more than 48 hours. If this happens, please call and we would consider antibiotics for possible diverticulitis.  2. Try to consume high fiber diet.    Diverticulosis  Diverticulosis is a condition that develops when small pouches (diverticula) form in the wall of the large intestine (colon). The colon is where water is absorbed and stool (feces) is formed. The pouches form when the inside layer of the colon pushes through weak spots in the outer layers of the colon. You may have a few pouches or many of them. The pouches usually do not cause problems unless they become inflamed or infected. When this happens, the condition is called diverticulitis. What are the causes? The cause of this condition is not known. What increases the risk? The following factors may make you more likely to develop this condition:  Being older than age 74. Your risk for this condition increases with age. Diverticulosis is rare among people younger than age 75. By age 78, many people have it.  Eating a low-fiber diet.  Having frequent constipation.  Being overweight.  Not getting enough exercise.  Smoking.  Taking over-the-counter pain medicines, like aspirin and ibuprofen.  Having a family history of diverticulosis. What are the signs or symptoms? In most people, there are no symptoms of this condition. If you do have symptoms, they may include:  Bloating.  Cramps in the abdomen.  Constipation or diarrhea.  Pain in the lower left side of the abdomen. How is this diagnosed? Because diverticulosis usually has no symptoms, it is most often diagnosed during an exam for other colon problems. The condition may be diagnosed by:  Using a flexible scope to examine the colon (colonoscopy).  Taking an X-ray of the colon after dye has been put into the colon (barium enema).  Having a CT scan. How is this treated? You may not need treatment for this  condition. Your health care provider may recommend treatment to prevent problems. You may need treatment if you have symptoms or if you previously had diverticulitis. Treatment may include:  Eating a high-fiber diet.  Taking a fiber supplement.  Taking a live bacteria supplement (probiotic).  Taking medicine to relax your colon. Follow these instructions at home: Medicines  Take over-the-counter and prescription medicines only as told by your health care provider.  If told by your health care provider, take a fiber supplement or probiotic. Constipation prevention Your condition may cause constipation. To prevent or treat constipation, you may need to:  Drink enough fluid to keep your urine pale yellow.  Take over-the-counter or prescription medicines.  Eat foods that are high in fiber, such as beans, whole grains, and fresh fruits and vegetables.  Limit foods that are high in fat and processed sugars, such as fried or sweet foods.  General instructions  Try not to strain when you have a bowel movement.  Keep all follow-up visits as told by your health care provider. This is important. Contact a health care provider if you:  Have pain in your abdomen.  Have bloating.  Have cramps.  Have not had a bowel movement in 3 days. Get help right away if:  Your pain gets worse.  Your bloating becomes very bad.  You have a fever or chills, and your symptoms suddenly get worse.  You vomit.  You have bowel movements that are bloody or black.  You have bleeding from your rectum. Summary  Diverticulosis is a condition that develops  when small pouches (diverticula) form in the wall of the large intestine (colon).  You may have a few pouches or many of them.  This condition is most often diagnosed during an exam for other colon problems.  Treatment may include increasing the fiber in your diet, taking supplements, or taking medicines. This information is not intended to  replace advice given to you by your health care provider. Make sure you discuss any questions you have with your health care provider. Document Revised: 10/21/2018 Document Reviewed: 10/21/2018 Elsevier Patient Education  [place].

## 2020-05-11 ENCOUNTER — Other Ambulatory Visit (HOSPITAL_COMMUNITY): Payer: Self-pay | Admitting: Family Medicine

## 2020-05-11 DIAGNOSIS — Z1231 Encounter for screening mammogram for malignant neoplasm of breast: Secondary | ICD-10-CM

## 2020-05-17 ENCOUNTER — Ambulatory Visit (HOSPITAL_COMMUNITY)
Admission: RE | Admit: 2020-05-17 | Discharge: 2020-05-17 | Disposition: A | Discharge status: Home / Self Care | Payer: Medicare HMO | Source: Ambulatory Visit | Attending: Family Medicine | Admitting: Family Medicine

## 2020-05-17 ENCOUNTER — Other Ambulatory Visit: Payer: Self-pay

## 2020-05-17 DIAGNOSIS — Z1231 Encounter for screening mammogram for malignant neoplasm of breast: Secondary | ICD-10-CM | POA: Insufficient documentation

## 2020-05-30 ENCOUNTER — Other Ambulatory Visit (HOSPITAL_COMMUNITY): Payer: Self-pay | Admitting: Nephrology

## 2020-05-30 ENCOUNTER — Other Ambulatory Visit: Payer: Self-pay | Admitting: Nephrology

## 2020-05-30 DIAGNOSIS — D638 Anemia in other chronic diseases classified elsewhere: Secondary | ICD-10-CM

## 2020-05-30 DIAGNOSIS — I129 Hypertensive chronic kidney disease with stage 1 through stage 4 chronic kidney disease, or unspecified chronic kidney disease: Secondary | ICD-10-CM

## 2020-05-30 DIAGNOSIS — N170 Acute kidney failure with tubular necrosis: Secondary | ICD-10-CM

## 2020-06-15 ENCOUNTER — Ambulatory Visit: Payer: Medicare HMO | Admitting: Obstetrics & Gynecology

## 2020-06-15 ENCOUNTER — Encounter: Payer: Self-pay | Admitting: Obstetrics & Gynecology

## 2020-06-15 ENCOUNTER — Other Ambulatory Visit: Payer: Self-pay

## 2020-06-15 ENCOUNTER — Other Ambulatory Visit (HOSPITAL_COMMUNITY)
Admission: RE | Admit: 2020-06-15 | Discharge: 2020-06-15 | Disposition: A | Discharge status: Home / Self Care | Payer: Medicare HMO | Source: Ambulatory Visit | Attending: Obstetrics & Gynecology | Admitting: Obstetrics & Gynecology

## 2020-06-15 VITALS — BP 176/79 | HR 62 | Ht 60.5 in | Wt 246.0 lb

## 2020-06-15 DIAGNOSIS — Z780 Asymptomatic menopausal state: Secondary | ICD-10-CM

## 2020-06-15 DIAGNOSIS — Z124 Encounter for screening for malignant neoplasm of cervix: Secondary | ICD-10-CM

## 2020-06-15 DIAGNOSIS — Z1151 Encounter for screening for human papillomavirus (HPV): Secondary | ICD-10-CM | POA: Insufficient documentation

## 2020-06-15 DIAGNOSIS — Z01419 Encounter for gynecological examination (general) (routine) without abnormal findings: Secondary | ICD-10-CM

## 2020-06-15 NOTE — Progress Notes (Signed)
   WELL-WOMAN EXAMINATION Patient name: Stacey Mcneil MRN [id number]  Date of birth: 1950/06/11 Chief Complaint:   Gynecologic Exam  History of Present Illness:   Stacey Mcneil is a 70 y.o. G70P2002 African American female being seen today for a routine well-woman exam.  Current complaints: none  Depression screen [facility] 2/9 06/15/2020  Decreased Interest 0  Down, Depressed, Hopeless 0  PHQ - 2 Score 0  Altered sleeping 0  Tired, decreased energy 0  Change in appetite 0  Feeling bad or failure about yourself  0  Trouble concentrating 0  Moving slowly or fidgety/restless 0  Suicidal thoughts 0  PHQ-9 Score 0     PCP: Burman Freestone      does not desire labs No LMP recorded. Patient is postmenopausal. Last pap unsure Last mammogram: 2022. Results were: normal.  Last colonoscopy: 2017  Review of Systems:   Pertinent items are noted in HPI Denies any headaches, blurred vision, fatigue, shortness of breath, chest pain, abdominal pain, abnormal vaginal discharge/itching/odor/irritation, problems with periods, bowel movements, urination, or intercourse unless otherwise stated above. Pertinent History Reviewed:  Reviewed past medical,surgical, social and family history.  Reviewed problem list, medications and allergies. Physical Assessment:   Vitals:   06/15/20 0941 06/15/20 0954  BP: (!) 172/91 (!) 176/79  Pulse: 72 62  Weight: 246 lb (111.6 kg)   Height: 5' 0.5" (1.537 m)   Body mass index is 47.25 kg/m.        Physical Examination:   General appearance - well appearing, and in no distress  Mental status - alert, oriented to person, place, and time  Psych:  She has a normal mood and affect  Skin - warm and dry, normal color, no suspicious lesions noted  Chest - effort normal, all lung fields clear to auscultation bilaterally  Heart - normal rate and regular rhythm  Neck:  midline trachea, no thyromegaly or nodules  Breasts - breasts appear normal, no suspicious  masses, no skin or nipple changes or  axillary nodes  Abdomen - obese, soft, nontender, nondistended, no masses or organomegaly  Pelvic - VULVA: normal appearing vulva with no masses, tenderness or lesions  VAGINA: normal appearing vagina with normal color and discharge, no lesions  CERVIX: normal appearing cervix without discharge or lesions, no CMT  Thin prep pap is done with HR HPV cotesting  UTERUS: uterus is felt to be normal size, shape, consistency and nontender   ADNEXA: No adnexal masses or tenderness noted.  Extremities:  1+ edema or varicosities noted  Chaperone: Levy Pupa    No results found for this or any previous visit (from the past 24 hour(s)).  Assessment & Plan:  1) Well-Woman Exam -pap collected, reviewed screening guidelines- if negative today no further pap test warranted -plan for bone density  Orders Placed This Encounter  Procedures  . DG Bone Density    Meds: No orders of the defined types were placed in this encounter.   Follow-up: Return in about 1 year (around 06/15/2021), or **please let her know that we will call her about scheduling the DEXA(bone density) scan, for annual.  Janyth Pupa, DO Attending [place], [place] for [facility] [place]

## 2020-06-18 LAB — CYTOLOGY - PAP
Adequacy: ABSENT
Chlamydia: NEGATIVE
Comment: NEGATIVE
Comment: NEGATIVE
Comment: NORMAL
Diagnosis: NEGATIVE
High risk HPV: NEGATIVE
Neisseria Gonorrhea: NEGATIVE

## 2020-06-22 ENCOUNTER — Telehealth: Payer: Self-pay

## 2020-06-22 NOTE — Telephone Encounter (Signed)
Sent msg to Gloris [place] to mail results to pt per request.

## 2020-06-25 ENCOUNTER — Ambulatory Visit (HOSPITAL_COMMUNITY)
Admission: RE | Admit: 2020-06-25 | Discharge: 2020-06-25 | Disposition: A | Discharge status: Home / Self Care | Payer: Medicare HMO | Source: Ambulatory Visit | Attending: Obstetrics & Gynecology | Admitting: Obstetrics & Gynecology

## 2020-06-25 ENCOUNTER — Other Ambulatory Visit: Payer: Self-pay

## 2020-06-25 DIAGNOSIS — Z780 Asymptomatic menopausal state: Secondary | ICD-10-CM | POA: Insufficient documentation

## 2020-06-27 ENCOUNTER — Telehealth: Payer: Self-pay

## 2020-06-27 NOTE — Telephone Encounter (Signed)
Called pt to inform her of BD test results. Confirmed that pt received pap smear results via mail as requested.

## 2020-06-27 NOTE — Telephone Encounter (Signed)
-----   Message from Janyth Pupa, DO sent at 06/27/2020  8:10 AM EDT ----- Please let patient know that she had a normal bone density- no evidence of osteoporosis.  Thanks!  ----- Message ----- From: Interface, Rad Results In Sent: 06/25/2020   9:09 PM EDT To: Janyth Pupa, DO

## 2020-07-05 ENCOUNTER — Ambulatory Visit (HOSPITAL_COMMUNITY)
Admission: RE | Admit: 2020-07-05 | Discharge: 2020-07-05 | Disposition: A | Discharge status: Home / Self Care | Payer: Medicare HMO | Source: Ambulatory Visit | Attending: Nephrology | Admitting: Nephrology

## 2020-07-05 ENCOUNTER — Encounter (HOSPITAL_COMMUNITY): Payer: Self-pay

## 2020-07-05 ENCOUNTER — Other Ambulatory Visit: Payer: Self-pay

## 2020-07-05 ENCOUNTER — Encounter (HOSPITAL_COMMUNITY)
Admission: RE | Admit: 2020-07-05 | Discharge: 2020-07-05 | Disposition: A | Discharge status: Home / Self Care | Payer: Medicare HMO | Source: Ambulatory Visit | Attending: Nephrology | Admitting: Nephrology

## 2020-07-05 DIAGNOSIS — D509 Iron deficiency anemia, unspecified: Secondary | ICD-10-CM | POA: Diagnosis present

## 2020-07-05 DIAGNOSIS — D638 Anemia in other chronic diseases classified elsewhere: Secondary | ICD-10-CM | POA: Insufficient documentation

## 2020-07-05 DIAGNOSIS — N170 Acute kidney failure with tubular necrosis: Secondary | ICD-10-CM | POA: Insufficient documentation

## 2020-07-05 DIAGNOSIS — I129 Hypertensive chronic kidney disease with stage 1 through stage 4 chronic kidney disease, or unspecified chronic kidney disease: Secondary | ICD-10-CM | POA: Insufficient documentation

## 2020-07-05 HISTORY — DX: Anemia, unspecified: D64.9

## 2020-07-05 MED ORDER — SODIUM CHLORIDE 0.9 % IV SOLN
510.0000 mg | Freq: Once | INTRAVENOUS | Status: AC
  Administered 2020-07-05: 510 mg via INTRAVENOUS
  Filled 2020-07-05: qty 510

## 2020-07-05 MED ORDER — SODIUM CHLORIDE 0.9 % IV SOLN: Freq: Once | INTRAVENOUS | Status: AC

## 2020-07-13 ENCOUNTER — Other Ambulatory Visit: Payer: Self-pay

## 2020-07-13 ENCOUNTER — Encounter (HOSPITAL_COMMUNITY): Payer: Self-pay

## 2020-07-13 ENCOUNTER — Encounter (HOSPITAL_COMMUNITY)
Admission: RE | Admit: 2020-07-13 | Discharge: 2020-07-13 | Disposition: A | Discharge status: Home / Self Care | Payer: Medicare HMO | Source: Ambulatory Visit | Attending: Nephrology | Admitting: Nephrology

## 2020-07-13 DIAGNOSIS — D509 Iron deficiency anemia, unspecified: Secondary | ICD-10-CM | POA: Insufficient documentation

## 2020-07-13 MED ORDER — SODIUM CHLORIDE 0.9 % IV SOLN
510.0000 mg | Freq: Once | INTRAVENOUS | Status: AC
  Administered 2020-07-13: 510 mg via INTRAVENOUS
  Filled 2020-07-13: qty 17

## 2020-07-13 MED ORDER — SODIUM CHLORIDE 0.9 % IV SOLN: Freq: Once | INTRAVENOUS | Status: DC

## 2020-08-30 ENCOUNTER — Ambulatory Visit: Payer: Medicare HMO | Admitting: Internal Medicine

## 2020-10-04 ENCOUNTER — Other Ambulatory Visit (HOSPITAL_COMMUNITY)
Admission: RE | Admit: 2020-10-04 | Discharge: 2020-10-04 | Disposition: A | Discharge status: Home / Self Care | Payer: Medicare HMO | Source: Ambulatory Visit | Attending: Nephrology | Admitting: Nephrology

## 2020-10-04 DIAGNOSIS — E1122 Type 2 diabetes mellitus with diabetic chronic kidney disease: Secondary | ICD-10-CM | POA: Insufficient documentation

## 2020-10-04 DIAGNOSIS — E559 Vitamin D deficiency, unspecified: Secondary | ICD-10-CM | POA: Insufficient documentation

## 2020-10-04 DIAGNOSIS — D508 Other iron deficiency anemias: Secondary | ICD-10-CM | POA: Diagnosis present

## 2020-10-04 DIAGNOSIS — N189 Chronic kidney disease, unspecified: Secondary | ICD-10-CM | POA: Insufficient documentation

## 2020-10-04 DIAGNOSIS — E211 Secondary hyperparathyroidism, not elsewhere classified: Secondary | ICD-10-CM | POA: Insufficient documentation

## 2020-10-04 DIAGNOSIS — D638 Anemia in other chronic diseases classified elsewhere: Secondary | ICD-10-CM | POA: Diagnosis present

## 2020-10-04 DIAGNOSIS — I129 Hypertensive chronic kidney disease with stage 1 through stage 4 chronic kidney disease, or unspecified chronic kidney disease: Secondary | ICD-10-CM | POA: Diagnosis present

## 2020-10-04 LAB — CBC
HCT: 38.7 % (ref 36.0–46.0)
Hemoglobin: 12.1 g/dL (ref 12.0–15.0)
MCH: 28 pg (ref 26.0–34.0)
MCHC: 31.3 g/dL (ref 30.0–36.0)
MCV: 89.6 fL (ref 80.0–100.0)
Platelets: 208 10*3/uL (ref 150–400)
RBC: 4.32 MIL/uL (ref 3.87–5.11)
RDW: 14.4 % (ref 11.5–15.5)
WBC: 4.8 10*3/uL (ref 4.0–10.5)
nRBC: 0 % (ref 0.0–0.2)

## 2020-10-04 LAB — IRON AND TIBC
Iron: 86 ug/dL (ref 28–170)
Saturation Ratios: 26 % (ref 10.4–31.8)
TIBC: 332 ug/dL (ref 250–450)
UIBC: 246 ug/dL

## 2020-10-04 LAB — PROTEIN / CREATININE RATIO, URINE
Creatinine, Urine: 149.73 mg/dL
Total Protein, Urine: <6 mg/dL

## 2020-10-04 LAB — RENAL FUNCTION PANEL
Albumin: 3.5 g/dL (ref 3.5–5.0)
Anion gap: 7 (ref 5–15)
BUN: 20 mg/dL (ref 8–23)
CO2: 28 mmol/L (ref 22–32)
Calcium: 9.2 mg/dL (ref 8.9–10.3)
Chloride: 104 mmol/L (ref 98–111)
Creatinine, Ser: 1.07 mg/dL — ABNORMAL HIGH (ref 0.44–1.00)
GFR, Estimated: 57 mL/min — ABNORMAL LOW (ref >60)
Glucose, Bld: 92 mg/dL (ref 70–99)
Phosphorus: 3.2 mg/dL (ref 2.5–4.6)
Potassium: 3.8 mmol/L (ref 3.5–5.1)
Sodium: 139 mmol/L (ref 135–145)

## 2020-10-04 LAB — FERRITIN: Ferritin: 434 ng/mL — ABNORMAL HIGH (ref 11–307)

## 2020-10-10 ENCOUNTER — Ambulatory Visit: Payer: Medicare HMO | Admitting: Internal Medicine

## 2020-10-10 ENCOUNTER — Encounter: Payer: Self-pay | Admitting: Internal Medicine

## 2020-10-10 ENCOUNTER — Telehealth: Payer: Self-pay | Admitting: Internal Medicine

## 2020-10-10 ENCOUNTER — Other Ambulatory Visit: Payer: Self-pay

## 2020-10-10 VITALS — BP 134/69 | HR 67 | Temp 97.5°F | Ht 61.0 in | Wt 244.2 lb

## 2020-10-10 DIAGNOSIS — R1030 Lower abdominal pain, unspecified: Secondary | ICD-10-CM | POA: Diagnosis not present

## 2020-10-10 DIAGNOSIS — R1319 Other dysphagia: Secondary | ICD-10-CM | POA: Diagnosis not present

## 2020-10-10 DIAGNOSIS — R1013 Epigastric pain: Secondary | ICD-10-CM | POA: Diagnosis not present

## 2020-10-10 NOTE — Telephone Encounter (Signed)
Pt was seen today and was calling to schedule her EGD. [phone]

## 2020-10-10 NOTE — Progress Notes (Signed)
Referring Provider: [facility] Primary Care Physician:  [facility] [place] [facility]:  Dr. Abbey Chatters  Chief Complaint  Patient presents with   Gastroesophageal Reflux   Abdominal Pain    Mid upper abd and mid lower abd. Occ gets nauseated if doesn't eat when hungry    HPI:   Stacey Mcneil is a 70 y.o. female who presents to the clinic today for follow-up visit.  She has a history of dyspepsia which is maintained on omeprazole.  Last upper endoscopy 2018 with Schatzki's ring status post dilation, mild gastritis with biopsies negative for H. pylori.  Today she states she has had intermittent epigastric pain.  Omeprazole keeps her symptoms under control for the most part though she is concerned that she may have an ulcer.  No chronic NSAID use.  Does note progressively worsening dysphagia similar to years ago.  Feels as though food is getting hung up in her substernal region.  No melena hematochezia.  Has normal bowel movements 1-2 times daily.  Last colonoscopy in [facility], dated October 2017, perianal skin tag found on the perianal area, diverticulosis, nonbleeding internal hemorrhoids. Quality of the prep was good.  Repeat colonoscopy in 10 years.  Past Medical History:  Diagnosis Date   Anemia    Chronic kidney disease    Diabetes mellitus without complication ([place])    Hypertension     Past Surgical History:  Procedure Laterality Date   COLONOSCOPY  01/2016   [facility] healthcare: Perianal skin tag found in the perianal area, diverticulosis, nonbleeding internal hemorrhoids.  Quality of prep was good.  Next colonoscopy 10 years   ESOPHAGOGASTRODUODENOSCOPY N/A 01/20/2017   Fields: Mild Schatzki ring status post dilation, gastritis (benign pathology with no H. 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)   SAVORY DILATION N/A 01/20/2017   Procedure: SAVORY DILATION;  Surgeon: Danie Binder, MD;  Location: [facility]  ENDO SUITE;  Service: Endoscopy;  Laterality: N/A;    Current Outpatient Medications  Medication Sig Dispense Refill   atorvastatin (LIPITOR) 10 MG tablet Take 10 mg by mouth 3 (three) times a week.     Cholecalciferol 25 MCG (1000 UT) capsule Take 1 capsule by mouth daily.     Cyanocobalamin (B-12) 100 MCG TABS Take 100 tablets by mouth daily.     hydrochlorothiazide (HYDRODIURIL) 25 MG tablet Take 25 mg by mouth daily.  5   lisinopril (PRINIVIL,ZESTRIL) 10 MG tablet Take 10 mg by mouth daily.   5   metFORMIN (GLUCOPHAGE) 500 MG tablet Take 500 mg by mouth daily.     metoprolol succinate (TOPROL-XL) 50 MG 24 hr tablet Take 50 mg by mouth daily.  0   Multiple Vitamins-Minerals (MULTIVITAMIN WITH MINERALS) tablet Take by mouth daily.     Omega-3 Fatty Acids (FISH OIL) 1200 MG CAPS Take 1,200 mg by mouth daily.      omeprazole (PRILOSEC) 40 MG capsule Take 40 mg by mouth daily.  5   No current facility-administered medications for this visit.    Allergies as of 10/10/2020   (No Known Allergies)    Family History  Problem Relation Age of Onset   Congestive Heart Failure Father    Stroke Mother    Leukemia Brother    Heart attack Sister    Hypertension Sister    Diabetes Sister    Other Brother        double pneumonia; paralyzed  Kidney disease Brother    Diabetes Brother    Hypertension Brother    Other Brother        on dialysis   Hypertension Sister    Diabetes Sister    Hypertension Sister    Diabetes Sister    Stroke Sister    Hypertension Daughter    Hypertension Daughter    Colon cancer Neg Hx    Colon polyps Neg Hx     Social History   Socioeconomic History   Marital status: Single    Spouse name: Not on file   Number of children: Not on file   Years of education: Not on file   Highest education level: Not on file  Occupational History   Not on file  Tobacco Use   Smoking status: Never   Smokeless tobacco: Never  Vaping Use   Vaping Use: Never used   Substance and Sexual Activity   Alcohol use: No   Drug use: No   Sexual activity: Yes    Birth control/protection: Post-menopausal  Other Topics Concern   Not on file  Social History Narrative   ONE PARTNER.    Social Determinants of Health   Financial Resource Strain: Low Risk    Difficulty of Paying Living Expenses: Not hard at all  Food Insecurity: No Food Insecurity   Worried About Charity fundraiser in the Last Year: Never true   [place] in the Last Year: Never true  Transportation Needs: No Transportation Needs   Lack of Transportation (Medical): No   Lack of Transportation (Non-Medical): No  Physical Activity: Insufficiently Active   Days of Exercise per Week: 1 day   Minutes of Exercise per Session: 10 min  Stress: No Stress Concern Present   Feeling of Stress : Not at all  Social Connections: Moderately Integrated   Frequency of Communication with Friends and Family: More than three times a week   Frequency of Social Gatherings with Friends and Family: Twice a week   Attends Religious Services: More than 4 times per year   Active Member of [employer] or Organizations: Yes   Attends Archivist Meetings: More than 4 times per year   Marital Status: Widowed    Subjective: Review of Systems  Constitutional:  Negative for chills and fever.  HENT:  Negative for congestion and hearing loss.   Eyes:  Negative for blurred vision and double vision.  Respiratory:  Negative for cough and shortness of breath.   Cardiovascular:  Negative for chest pain and palpitations.  Gastrointestinal:  Positive for abdominal pain. Negative for blood in stool, constipation, diarrhea, heartburn, melena and vomiting.       Dysphagia  Genitourinary:  Negative for dysuria and urgency.  Musculoskeletal:  Negative for joint pain and myalgias.  Skin:  Negative for itching and rash.  Neurological:  Negative for dizziness and headaches.  Psychiatric/Behavioral:  Negative for  depression. The patient is not nervous/anxious.     Objective: BP 134/69   Pulse 67   Temp (!) 97.5 F (36.4 C) (Temporal)   Ht 5\' 1"  (1.549 m)   Wt 244 lb 3.2 oz (110.8 kg)   BMI 46.14 kg/m  Physical Exam Constitutional:      Appearance: Normal appearance. She is obese.  HENT:     Head: Normocephalic and atraumatic.  Eyes:     Extraocular Movements: Extraocular movements intact.     Conjunctiva/sclera: Conjunctivae normal.  Cardiovascular:     Rate and  Rhythm: Normal rate and regular rhythm.     Heart sounds: Murmur heard.  Pulmonary:     Effort: Pulmonary effort is normal.     Breath sounds: Normal breath sounds.  Abdominal:     General: Bowel sounds are normal.     Palpations: Abdomen is soft.  Musculoskeletal:        General: No swelling. Normal range of motion.     Cervical back: Normal range of motion and neck supple.  Skin:    General: Skin is warm and dry.     Coloration: Skin is not jaundiced.  Neurological:     General: No focal deficit present.     Mental Status: She is alert and oriented to person, place, and time.  Psychiatric:        Mood and Affect: Mood normal.        Behavior: Behavior normal.     Assessment: *Dyspepsia *Epigastric pain  *Dysphagia  Plan: Will schedule for EGD with possible esophageal dilation to evaluate for peptic ulcer disease, esophagitis, gastritis, H. Pylori, duodenitis, or other. Will also evaluate for esophageal stricture, Schatzki's ring, esophageal web or other.   The risks including infection, bleed, or perforation as well as benefits, limitations, alternatives and imponderables have been reviewed with the patient. Potential for esophageal dilation, biopsy, etc. have also been reviewed.  Questions have been answered. All parties agreeable.  Continue on omeprazole.  We will print out home practices to decrease reflux symptoms at home.  Work on weight loss.   10/10/2020 9:46 AM   Disclaimer: This note was  dictated with voice recognition software. Similar sounding words can inadvertently be transcribed and may not be corrected upon review.

## 2020-10-10 NOTE — Telephone Encounter (Signed)
Called pt, EGD/DIL w/Propofol ASA 3 w/Dr. Abbey Chatters scheduled for 11/12/20 at 9:30am.

## 2020-10-10 NOTE — Patient Instructions (Signed)
We will schedule you for upper endoscopy to further evaluate your abdominal pain and difficulty swallowing.  I may perform esophageal dilation at that time to help with your swallowing.  Continue on omeprazole.  Further recommendations to follow  Lifestyle and home remedies TO MANAGE REFLUX/HEARTBURN    You may eliminate or reduce the frequency of heartburn by making the following lifestyle changes:   Control your weight. Being overweight is a major risk factor for heartburn and GERD. Excess pounds put pressure on your abdomen, pushing up your stomach and causing acid to back up into your esophagus.    Eat smaller meals. 4 TO 6 MEALS A DAY. This reduces pressure on the lower esophageal sphincter, helping to prevent the valve from opening and acid from washing back into your esophagus.     Loosen your belt. Clothes that fit tightly around your waist put pressure on your abdomen and the lower esophageal sphincter.     Eliminate heartburn triggers. Everyone has specific triggers. Common triggers such as fatty or fried foods, spicy food, tomato sauce, carbonated beverages, alcohol, chocolate, mint, garlic, onion, caffeine and nicotine may make heartburn worse.    Avoid stooping or bending. Tying your shoes is OK. Bending over for longer periods to weed your garden isn't, especially soon after eating.    Don't lie down after a meal. Wait at least three to four hours after eating before going to bed, and don't lie down right after eating.    At [facility] we value your feedback. You may receive a survey about your visit today. Please share your experience as we strive to create trusting relationships with our patients to provide genuine, compassionate, quality care.  We appreciate your understanding and patience as we review any laboratory studies, imaging, and other diagnostic tests that are ordered as we care for you. Our office policy is 5 business days for review of these  results, and any emergent or urgent results are addressed in a timely manner for your best interest. If you do not hear from our office in 1 week, please contact us.   We also encourage the use of MyChart, which contains your medical information for your review as well. If you are not enrolled in this feature, an access code is on this after visit summary for your convenience. Thank you for allowing [place] to be involved in your care.  It was great to see you today!  I hope you have a great rest of your summer!!    Stacey Mcneil. Abbey Chatters, D.O. Gastroenterology and Hepatology [facility]

## 2020-10-11 NOTE — Telephone Encounter (Signed)
PA for EGD/DIL submitted via [employer] website. [employer]# [id number], valid 11/12/20-12/12/20.

## 2020-10-11 NOTE — Telephone Encounter (Signed)
Pre-op appt 11/08/20. Appt letter mailed with procedure instructions.

## 2020-11-05 NOTE — Patient Instructions (Signed)
RHONDALYN MANIGAULT  11/05/2020     '@PREFPERIOPPHARMACY'$ @   Your procedure is scheduled on  11/12/2020.   Report to [facility] at  0730 A.M.   Call this number if you have problems the morning of surgery:  [phone]   Remember:  Follow the diet instructions given to you by the office.    Take these medicines the morning of surgery with A SIP OF WATER                               metoprolol, prilosec.     Do not wear jewelry, make-up or nail polish.  Do not wear lotions, powders, or perfumes, or deodorant.  Do not shave 48 hours prior to surgery.  Men may shave face and neck.  Do not bring valuables to the hospital.  [facility] is not responsible for any belongings or valuables.  Contacts, dentures or bridgework may not be worn into surgery.  Leave your suitcase in the car.  After surgery it may be brought to your room.  For patients admitted to the hospital, discharge time will be determined by your treatment team.  Patients discharged the day of surgery will not be allowed to drive home and must have someone with them for 24 hours.    Special instructions:   DO NOT smoke tobacco or vape for 24 hours before your procedure.  Please read over the following fact sheets that you were given. Anesthesia Post-op Instructions and Care and Recovery After Surgery      Upper Endoscopy, Adult, Care After This sheet gives you information about how to care for yourself after your procedure. Your health care provider may also give you more specific instructions. If you have problems or questions, contact your health careprovider. What can I expect after the procedure? After the procedure, it is common to have: A sore throat. Mild stomach pain or discomfort. Bloating. Nausea. Follow these instructions at home:  Follow instructions from your health care provider about what to eat or drink after your procedure. Return to your normal activities as told by your health care  provider. Ask your health care provider what activities are safe for you. Take over-the-counter and prescription medicines only as told by your health care provider. If you were given a sedative during the procedure, it can affect you for several hours. Do not drive or operate machinery until your health care provider says that it is safe. Keep all follow-up visits as told by your health care provider. This is important. Contact a health care provider if you have: A sore throat that lasts longer than one day. Trouble swallowing. Get help right away if: You vomit blood or your vomit looks like coffee grounds. You have: A fever. Bloody, black, or tarry stools. A severe sore throat or you cannot swallow. Difficulty breathing. Severe pain in your chest or abdomen. Summary After the procedure, it is common to have a sore throat, mild stomach discomfort, bloating, and nausea. If you were given a sedative during the procedure, it can affect you for several hours. Do not drive or operate machinery until your health care provider says that it is safe. Follow instructions from your health care provider about what to eat or drink after your procedure. Return to your normal activities as told by your health care provider. This information is not intended to replace advice given to  you by your health care provider. Make sure you discuss any questions you have with your healthcare provider. Document Revised: 03/22/2019 Document Reviewed: 08/24/2017 Elsevier Patient Education  2022 [employer]. https://www.asge.org/home/for-patients/patient-information/understanding-eso-dilation-updated">  Esophageal Dilatation Esophageal dilatation, also called esophageal dilation, is a procedure to widen or open a blocked or narrowed part of the esophagus. The esophagus is the part of the body that moves food and liquid from the mouth to the stomach. You may need this procedure if: You have a buildup of scar tissue in  your esophagus that makes it difficult, painful, or impossible to swallow. This can be caused by gastroesophageal reflux disease (GERD). You have cancer of the esophagus. There is a problem with how food moves through your esophagus. In some cases, you may need this procedure repeated at a later time to dilatethe esophagus gradually. Tell a health care provider about: Any allergies you have. All medicines you are taking, including vitamins, herbs, eye drops, creams, and over-the-counter medicines. Any problems you or family members have had with anesthetic medicines. Any blood disorders you have. Any surgeries you have had. Any medical conditions you have. Any antibiotic medicines you are required to take before dental procedures. Whether you are pregnant or may be pregnant. What are the risks? Generally, this is a safe procedure. However, problems may occur, including: Bleeding due to a tear in the lining of the esophagus. A hole, or perforation, in the esophagus. What happens before the procedure? Ask your health care provider about: Changing or stopping your regular medicines. This is especially important if you are taking diabetes medicines or blood thinners. Taking medicines such as aspirin and ibuprofen. These medicines can thin your blood. Do not take these medicines unless your health care provider tells you to take them. Taking over-the-counter medicines, vitamins, herbs, and supplements. Follow instructions from your health care provider about eating or drinking restrictions. Plan to have a responsible adult take you home from the hospital or clinic. Plan to have a responsible adult care for you for the time you are told after you leave the hospital or clinic. This is important. What happens during the procedure? You may be given a medicine to help you relax (sedative). A numbing medicine may be sprayed into the back of your throat, or you may gargle the medicine. Your health  care provider may perform the dilatation using various surgical instruments, such as: Simple dilators. This instrument is carefully placed in the esophagus to stretch it. Guided wire bougies. This involves using an endoscope to insert a wire into the esophagus. A dilator is passed over this wire to enlarge the esophagus. Then the wire is removed. Balloon dilators. An endoscope with a small balloon is inserted into the esophagus. The balloon is inflated to stretch the esophagus and open it up. The procedure may vary among health care providers and hospitals. What can I expect after the procedure? Your blood pressure, heart rate, breathing rate, and blood oxygen level will be monitored until you leave the hospital or clinic. Your throat may feel slightly sore and numb. This will get better over time. You will not be allowed to eat or drink until your throat is no longer numb. When you are able to drink, urinate, and sit on the edge of the bed without nausea or dizziness, you may be able to return home. Follow these instructions at home: Take over-the-counter and prescription medicines only as told by your health care provider. If you were given a sedative during the  procedure, it can affect you for several hours. Do not drive or operate machinery until your health care provider says that it is safe. Plan to have a responsible adult care for you for the time you are told. This is important. Follow instructions from your health care provider about any eating or drinking restrictions. Do not use any products that contain nicotine or tobacco, such as cigarettes, e-cigarettes, and chewing tobacco. If you need help quitting, ask your health care provider. Keep all follow-up visits. This is important. Contact a health care provider if: You have a fever. You have pain that is not relieved by medicine. Get help right away if: You have chest pain. You have trouble breathing. You have trouble  swallowing. You vomit blood. You have black, tarry, or bloody stools. These symptoms may represent a serious problem that is an emergency. Do not wait to see if the symptoms will go away. Get medical help right away. Call your local emergency services (911 in the [place] Do not drive yourself to the hospital. Summary Esophageal dilatation, also called esophageal dilation, is a procedure to widen or open a blocked or narrowed part of the esophagus. Plan to have a responsible adult take you home from the hospital or clinic. For this procedure, a numbing medicine may be sprayed into the back of your throat, or you may gargle the medicine. Do not drive or operate machinery until your health care provider says that it is safe. This information is not intended to replace advice given to you by your health care provider. Make sure you discuss any questions you have with your healthcare provider. Document Revised: 08/10/2019 Document Reviewed: 08/10/2019 Elsevier Patient Education  [place] After This sheet gives you information about how to care for yourself after your procedure. Your health care provider may also give you more specific instructions. If you have problems or questions, contact your health careprovider. What can I expect after the procedure? After the procedure, it is common to have: Tiredness. Forgetfulness about what happened after the procedure. Impaired judgment for important decisions. Nausea or vomiting. Some difficulty with balance. Follow these instructions at home: For the time period you were told by your health care provider:     Rest as needed. Do not participate in activities where you could fall or become injured. Do not drive or use machinery. Do not drink alcohol. Do not take sleeping pills or medicines that cause drowsiness. Do not make important decisions or sign legal documents. Do not take care of children on your  own. Eating and drinking Follow the diet that is recommended by your health care provider. Drink enough fluid to keep your urine pale yellow. If you vomit: Drink water, juice, or soup when you can drink without vomiting. Make sure you have little or no nausea before eating solid foods. General instructions Have a responsible adult stay with you for the time you are told. It is important to have someone help care for you until you are awake and alert. Take over-the-counter and prescription medicines only as told by your health care provider. If you have sleep apnea, surgery and certain medicines can increase your risk for breathing problems. Follow instructions from your health care provider about wearing your sleep device: Anytime you are sleeping, including during daytime naps. While taking prescription pain medicines, sleeping medicines, or medicines that make you drowsy. Avoid smoking. Keep all follow-up visits as told by your health care provider. This is  important. Contact a health care provider if: You keep feeling nauseous or you keep vomiting. You feel light-headed. You are still sleepy or having trouble with balance after 24 hours. You develop a rash. You have a fever. You have redness or swelling around the IV site. Get help right away if: You have trouble breathing. You have new-onset confusion at home. Summary For several hours after your procedure, you may feel tired. You may also be forgetful and have poor judgment. Have a responsible adult stay with you for the time you are told. It is important to have someone help care for you until you are awake and alert. Rest as told. Do not drive or operate machinery. Do not drink alcohol or take sleeping pills. Get help right away if you have trouble breathing, or if you suddenly become confused. This information is not intended to replace advice given to you by your health care provider. Make sure you discuss any questions you  have with your healthcare provider. Document Revised: 12/08/2019 Document Reviewed: 02/24/2019 Elsevier Patient Education  2022 [employer].

## 2020-11-08 ENCOUNTER — Encounter (HOSPITAL_COMMUNITY)
Admission: RE | Admit: 2020-11-08 | Discharge: 2020-11-08 | Disposition: A | Discharge status: Home / Self Care | Payer: Medicare HMO | Source: Ambulatory Visit | Attending: Internal Medicine | Admitting: Internal Medicine

## 2020-11-08 ENCOUNTER — Other Ambulatory Visit: Payer: Self-pay

## 2020-11-08 ENCOUNTER — Encounter (HOSPITAL_COMMUNITY): Payer: Self-pay

## 2020-11-08 DIAGNOSIS — Z01818 Encounter for other preprocedural examination: Secondary | ICD-10-CM | POA: Diagnosis not present

## 2020-11-08 LAB — BASIC METABOLIC PANEL
Anion gap: 5 (ref 5–15)
BUN: 24 mg/dL — ABNORMAL HIGH (ref 8–23)
CO2: 28 mmol/L (ref 22–32)
Calcium: 8.8 mg/dL — ABNORMAL LOW (ref 8.9–10.3)
Chloride: 104 mmol/L (ref 98–111)
Creatinine, Ser: 1.09 mg/dL — ABNORMAL HIGH (ref 0.44–1.00)
GFR, Estimated: 55 mL/min — ABNORMAL LOW (ref >60)
Glucose, Bld: 91 mg/dL (ref 70–99)
Potassium: 3.5 mmol/L (ref 3.5–5.1)
Sodium: 137 mmol/L (ref 135–145)

## 2020-11-12 ENCOUNTER — Encounter (HOSPITAL_COMMUNITY): Payer: Self-pay

## 2020-11-12 ENCOUNTER — Ambulatory Visit (HOSPITAL_COMMUNITY): Payer: Medicare HMO | Admitting: Certified Registered"

## 2020-11-12 ENCOUNTER — Encounter (HOSPITAL_COMMUNITY)
Admission: RE | Disposition: A | Discharge status: Home / Self Care | Payer: Self-pay | Source: Home / Self Care | Attending: Internal Medicine

## 2020-11-12 ENCOUNTER — Ambulatory Visit (HOSPITAL_COMMUNITY)
Admission: RE | Admit: 2020-11-12 | Discharge: 2020-11-12 | Disposition: A | Discharge status: Home / Self Care | Payer: Medicare HMO | Attending: Internal Medicine | Admitting: Internal Medicine

## 2020-11-12 ENCOUNTER — Other Ambulatory Visit: Payer: Self-pay

## 2020-11-12 DIAGNOSIS — R1310 Dysphagia, unspecified: Secondary | ICD-10-CM | POA: Diagnosis not present

## 2020-11-12 DIAGNOSIS — Z79899 Other long term (current) drug therapy: Secondary | ICD-10-CM | POA: Diagnosis not present

## 2020-11-12 DIAGNOSIS — K2970 Gastritis, unspecified, without bleeding: Secondary | ICD-10-CM

## 2020-11-12 DIAGNOSIS — K2950 Unspecified chronic gastritis without bleeding: Secondary | ICD-10-CM | POA: Insufficient documentation

## 2020-11-12 DIAGNOSIS — K219 Gastro-esophageal reflux disease without esophagitis: Secondary | ICD-10-CM | POA: Diagnosis not present

## 2020-11-12 DIAGNOSIS — R1013 Epigastric pain: Secondary | ICD-10-CM | POA: Diagnosis not present

## 2020-11-12 DIAGNOSIS — K317 Polyp of stomach and duodenum: Secondary | ICD-10-CM | POA: Insufficient documentation

## 2020-11-12 DIAGNOSIS — Z7984 Long term (current) use of oral hypoglycemic drugs: Secondary | ICD-10-CM | POA: Diagnosis not present

## 2020-11-12 HISTORY — PX: BALLOON DILATION: SHX5330

## 2020-11-12 HISTORY — PX: ESOPHAGOGASTRODUODENOSCOPY (EGD) WITH PROPOFOL: SHX5813

## 2020-11-12 HISTORY — PX: BIOPSY: SHX5522

## 2020-11-12 LAB — GLUCOSE, CAPILLARY: Glucose-Capillary: 103 mg/dL — ABNORMAL HIGH (ref 70–99)

## 2020-11-12 SURGERY — ESOPHAGOGASTRODUODENOSCOPY (EGD) WITH PROPOFOL
Anesthesia: General

## 2020-11-12 MED ORDER — PROPOFOL 10 MG/ML IV BOLUS
INTRAVENOUS | Status: DC | PRN
  Administered 2020-11-12: 150 ug/kg/min via INTRAVENOUS
  Administered 2020-11-12: 100 mg via INTRAVENOUS

## 2020-11-12 MED ORDER — LACTATED RINGERS IV SOLN: INTRAVENOUS | Status: DC

## 2020-11-12 MED ORDER — LIDOCAINE HCL (CARDIAC) PF 100 MG/5ML IV SOSY
PREFILLED_SYRINGE | INTRAVENOUS | Status: DC | PRN
  Administered 2020-11-12: 100 mg via INTRAVENOUS

## 2020-11-12 NOTE — Anesthesia Preprocedure Evaluation (Signed)
Anesthesia Evaluation  Patient identified by MRN, date of birth, ID band Patient awake    Reviewed: Allergy & Precautions, NPO status , Patient's Chart, lab work & pertinent test results  Airway Mallampati: I  TM Distance: >3 FB Neck ROM: Full    Dental  (+) Missing   Pulmonary neg pulmonary ROS,    Pulmonary exam normal breath sounds clear to auscultation       Cardiovascular hypertension, negative cardio ROS Normal cardiovascular exam Rhythm:Regular Rate:Normal     Neuro/Psych negative neurological ROS  negative psych ROS   GI/Hepatic Neg liver ROS, GERD  ,  Endo/Other  diabetesMorbid obesity  Renal/GU Renal disease  negative genitourinary   Musculoskeletal negative musculoskeletal ROS (+)   Abdominal   Peds negative pediatric ROS (+)  Hematology  (+) anemia ,   Anesthesia Other Findings   Reproductive/Obstetrics negative OB ROS                             Anesthesia Physical Anesthesia Plan  ASA: 3  Anesthesia Plan: General   Post-op Pain Management:    Induction:   PONV Risk Score and Plan:   Airway Management Planned: Nasal Cannula and Natural Airway  Additional Equipment:   Intra-op Plan:   Post-operative Plan:   Informed Consent: I have reviewed the patients History and Physical, chart, labs and discussed the procedure including the risks, benefits and alternatives for the proposed anesthesia with the patient or authorized representative who has indicated his/her understanding and acceptance.       Plan Discussed with: CRNA  Anesthesia Plan Comments:         Anesthesia Quick Evaluation

## 2020-11-12 NOTE — Discharge Instructions (Addendum)
EGD Discharge instructions Please read the instructions outlined below and refer to this sheet in the next few weeks. These discharge instructions provide you with general information on caring for yourself after you leave the hospital. Your doctor may also give you specific instructions. While your treatment has been planned according to the most current medical practices available, unavoidable complications occasionally occur. If you have any problems or questions after discharge, please call your doctor. ACTIVITY You may resume your regular activity but move at a slower pace for the next 24 hours.  Take frequent rest periods for the next 24 hours.  Walking will help expel (get rid of) the air and reduce the bloated feeling in your abdomen.  No driving for 24 hours (because of the anesthesia (medicine) used during the test).  You may shower.  Do not sign any important legal documents or operate any machinery for 24 hours (because of the anesthesia used during the test).  NUTRITION Drink plenty of fluids.  You may resume your normal diet.  Begin with a light meal and progress to your normal diet.  Avoid alcoholic beverages for 24 hours or as instructed by your caregiver.  MEDICATIONS You may resume your normal medications unless your caregiver tells you otherwise.  WHAT YOU CAN EXPECT TODAY You may experience abdominal discomfort such as a feeling of fullness or "gas" pains.  FOLLOW-UP Your doctor will discuss the results of your test with you.  SEEK IMMEDIATE MEDICAL ATTENTION IF ANY OF THE FOLLOWING OCCUR: Excessive nausea (feeling sick to your stomach) and/or vomiting.  Severe abdominal pain and distention (swelling).  Trouble swallowing.  Temperature over 101 F (37.8 C).  Rectal bleeding or vomiting of blood.   Your EGD showed a mild amount of inflammation in her stomach.  I took biopsies of this to rule infection of the bacteria called H. pylori.  You also have a few small gastric  polyps which I removed.  Likely these are benign.  Did have a tightening of your esophagus so I stretched this with a balloon.  Hopefully this helps with your swallowing.  Continue on omeprazole daily.  This medication works best if you take it 30 minutes before breakfast.  Await pathology results, my office will contact you.  Follow-up with GI in 3 months. OFFICE TO CALL WITH APPOINTMENT   I hope you have a great rest of your week!  Elon Alas. Abbey Chatters, D.O. Gastroenterology and Hepatology [facility]

## 2020-11-12 NOTE — Op Note (Signed)
[facility] Patient Name: Stacey Mcneil Procedure Date: 11/12/2020 8:43 AM MRN: [id number] Date of Birth: 1950/07/29 Attending MD: Elon Alas. Abbey Chatters DO CSN: [id number] Age: 70 Admit Type: Outpatient Procedure:                Upper GI endoscopy Indications:              Dysphagia Providers:                Elon Alas. Abbey Chatters, DO, Janeece Riggers, RN, Thomas Hoff., Technician Referring MD:              Medicines:                See the Anesthesia note for documentation of the                            administered medications Complications:            No immediate complications. Estimated Blood Loss:     Estimated blood loss was minimal. Procedure:                Pre-Anesthesia Assessment:                           - The anesthesia plan was to use monitored                            anesthesia care (MAC).                           After obtaining informed consent, the endoscope was                            passed under direct vision. Throughout the                            procedure, the patient's blood pressure, pulse, and                            oxygen saturations were monitored continuously. The                            [id number]-H190 ([id number]) scope was introduced through the                            mouth, and advanced to the second part of duodenum.                            The upper GI endoscopy was accomplished without                            difficulty. The patient tolerated the procedure                            well. Scope In: 8:58:29 AM Scope  Out: 9:05:19 AM Total Procedure Duration: 0 hours 6 minutes 50 seconds  Findings:      There is no endoscopic evidence of Barrett's esophagus, bleeding, areas       of erosion, esophagitis or hiatal hernia in the entire esophagus.      No endoscopic abnormality was evident in the esophagus to explain the       patient's complaint of dysphagia. Preparations were made for empiric        dilation. A TTS dilator was passed through the scope. Dilation with an       18-19-20 mm balloon dilator was performed to 20 mm. Dilation was       performed with a mild resistance at 20 mm DUE TO POSSIBLE DISTAL       ESOPHAGEAL WEB. Estimated blood loss was none.      Multiple small sessile polyps with no bleeding and no stigmata of recent       bleeding were found in the gastric fundus. 2 largest polyps removed with       cold biopsy forceps.      Localized mild inflammation characterized by erythema was found in the       gastric antrum. Biopsies were taken with a cold forceps for Helicobacter       pylori testing.      The duodenal bulb, first portion of the duodenum and second portion of       the duodenum were normal. Impression:               - Multiple gastric polyps.                           - Gastritis. Biopsied.                           - Normal duodenal bulb, first portion of the                            duodenum and second portion of the duodenum. Moderate Sedation:      Per Anesthesia Care Recommendation:           - Patient has a contact number available for                            emergencies. The signs and symptoms of potential                            delayed complications were discussed with the                            patient. Return to normal activities tomorrow.                            Written discharge instructions were provided to the                            patient.                           - Resume previous diet.                           -  Continue present medications.                           - Await pathology results.                           - Repeat upper endoscopy PRN for retreatment.                           - Return to GI clinic in 3 months.                           - Use Prilosec (omeprazole) 40 mg PO daily. Procedure Code(s):        --- Professional ---                           [phone], Esophagogastroduodenoscopy, flexible,                             transoral; with biopsy, single or multiple Diagnosis Code(s):        --- Professional ---                           K31.7, Polyp of stomach and duodenum                           K29.70, Gastritis, unspecified, without bleeding                           R13.10, Dysphagia, unspecified CPT copyright 2019 American Medical Association. All rights reserved. The codes documented in this report are preliminary and upon coder review may  be revised to meet current compliance requirements. Elon Alas. Abbey Chatters, DO [place] Abbey Chatters, DO 11/12/2020 9:10:12 AM This report has been signed electronically. Number of Addenda: 0

## 2020-11-12 NOTE — Transfer of Care (Signed)
Immediate Anesthesia Transfer of Care Note  Patient: Stacey Mcneil  Procedure(s) Performed: ESOPHAGOGASTRODUODENOSCOPY (EGD) WITH PROPOFOL BALLOON DILATION BIOPSY  Patient Location: Short Stay  Anesthesia Type:General  Level of Consciousness: awake, alert , patient cooperative and responds to stimulation  Airway & Oxygen Therapy: Patient Spontanous Breathing  Post-op Assessment: Report given to RN, Post -op Vital signs reviewed and stable and Patient moving all extremities X 4  Post vital signs: Reviewed and stable  Last Vitals:  Vitals Value Taken Time  BP    Temp    Pulse    Resp    SpO2      Last Pain:  Vitals:   11/12/20 0754  TempSrc: Oral  PainSc: 0-No pain      Patients Stated Pain Goal: 8 ([id number])  Complications: No notable events documented.

## 2020-11-12 NOTE — H&P (Signed)
Primary Care Physician:  [facility] Gastro[facility]ologist:  Dr. Abbey Chatters  Pre-Procedure History & Physical: HPI:  Stacey Mcneil is a 69 y.o. female is here for an EGD with possible dilation due to GERD, dysphagia, epigastric pain.   Past Medical History:  Diagnosis Date   Anemia    Chronic kidney disease    Diabetes mellitus without complication ([place])    Hypertension     Past Surgical History:  Procedure Laterality Date   COLONOSCOPY  01/2016   [facility] healthcare: Perianal skin tag found in the perianal area, diverticulosis, nonbleeding internal hemorrhoids.  Quality of prep was good.  Next colonoscopy 10 years   ESOPHAGOGASTRODUODENOSCOPY N/A 01/20/2017   Fields: Mild Schatzki ring status post dilation, gastritis (benign pathology with no H. 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)   SAVORY DILATION N/A 01/20/2017   Procedure: SAVORY DILATION;  Surgeon: Danie Binder, MD;  Location: [facility];  Service: Endoscopy;  Laterality: N/A;    Prior to Admission medications   Medication Sig Start Date End Date Taking? Authorizing Provider  atorvastatin (LIPITOR) 10 MG tablet Take 10 mg by mouth every Monday, Wednesday, and Friday.   Yes [provider]  Cholecalciferol 25 MCG (1000 UT) capsule Take 1,000 Units by mouth daily. 06/28/20 06/28/21 Yes [provider]  Cyanocobalamin (B-12) 500 MCG TABS Take 500 mcg by mouth daily.   Yes [provider]  hydrochlorothiazide (HYDRODIURIL) 25 MG tablet Take 25 mg by mouth daily. 12/15/16  Yes [provider]  lisinopril (PRINIVIL,ZESTRIL) 10 MG tablet Take 10 mg by mouth daily.  12/15/16  Yes [provider]  metFORMIN (GLUCOPHAGE) 500 MG tablet Take 500 mg by mouth daily.   Yes [provider]  metoprolol succinate (TOPROL-XL) 50 MG 24 hr tablet Take 50 mg by mouth daily. 12/15/16  Yes [provider]  Multiple  Vitamins-Minerals (MULTIVITAMIN WITH MINERALS) tablet Take 1 tablet by mouth daily.   Yes [provider]  Omega-3 Fatty Acids (FISH OIL) 1200 MG CAPS Take 1,200 mg by mouth daily.    Yes [provider]  omeprazole (PRILOSEC) 40 MG capsule Take 40 mg by mouth daily. 12/15/16  Yes [provider]  acetaminophen (TYLENOL) 650 MG CR tablet Take 650 mg by mouth every 8 (eight) hours as needed for pain.    [provider]  cetirizine (ZYRTEC) 10 MG tablet Take 10 mg by mouth daily as needed for allergies.    [provider]    Allergies as of 10/11/2020   (No Known Allergies)    Family History  Problem Relation Age of Onset   Congestive Heart Failure Father    Stroke Mother    Leukemia Brother    Heart attack Sister    Hypertension Sister    Diabetes Sister    Other Brother        double pneumonia; paralyzed   Kidney disease Brother    Diabetes Brother    Hypertension Brother    Other Brother        on dialysis   Hypertension Sister    Diabetes Sister    Hypertension Sister    Diabetes Sister    Stroke Sister    Hypertension Daughter    Hypertension Daughter    Colon cancer Neg Hx    Colon polyps Neg Hx     Social History   Socioeconomic History   Marital status: Single  Spouse name: Not on file   Number of children: Not on file   Years of education: Not on file   Highest education level: Not on file  Occupational History   Not on file  Tobacco Use   Smoking status: Never   Smokeless tobacco: Never  Vaping Use   Vaping Use: Never used  Substance and Sexual Activity   Alcohol use: No   Drug use: No   Sexual activity: Yes    Birth control/protection: Post-menopausal  Other Topics Concern   Not on file  Social History Narrative   ONE PARTNER.    Social Determinants of Health   Financial Resource Strain: Low Risk    Difficulty of Paying Living Expenses: Not hard at all  Food Insecurity: No Food Insecurity    Worried About Charity fundraiser in the Last Year: Never true   [place] in the Last Year: Never true  Transportation Needs: No Transportation Needs   Lack of Transportation (Medical): No   Lack of Transportation (Non-Medical): No  Physical Activity: Insufficiently Active   Days of Exercise per Week: 1 day   Minutes of Exercise per Session: 10 min  Stress: No Stress Concern Present   Feeling of Stress : Not at all  Social Connections: Moderately Integrated   Frequency of Communication with Friends and Family: More than three times a week   Frequency of Social Gatherings with Friends and Family: Twice a week   Attends Religious Services: More than 4 times per year   Active Member of [employer] or Organizations: Yes   Attends Archivist Meetings: More than 4 times per year   Marital Status: Widowed  Human resources officer Violence: Not At Risk   Fear of Current or Ex-Partner: No   Emotionally Abused: No   Physically Abused: No   Sexually Abused: No    Review of Systems: See HPI, otherwise negative ROS  Physical Exam: Vital signs in last 24 hours: Temp:  [97.7 F (36.5 C)] 97.7 F (36.5 C) (08/08 0754) Resp:  [18] 18 (08/08 0754) BP: (155)/(78) 155/78 (08/08 0754) SpO2:  [98 %] 98 % (08/08 0754) Weight:  [110.7 kg] 110.7 kg (08/08 0754)   General:   Alert,  Well-developed, well-nourished, pleasant and cooperative in NAD Head:  Normocephalic and atraumatic. Eyes:  Sclera clear, no icterus.   Conjunctiva pink. Ears:  Normal auditory acuity. Nose:  No deformity, discharge,  or lesions. Mouth:  No deformity or lesions, dentition normal. Neck:  Supple; no masses or thyromegaly. Lungs:  Clear throughout to auscultation.   No wheezes, crackles, or rhonchi. No acute distress. Heart:  Regular rate and rhythm; no murmurs, clicks, rubs,  or gallops. Abdomen:  Soft, nontender and nondistended. No masses, hepatosplenomegaly or hernias noted. Normal bowel sounds, without guarding,  and without rebound.   Msk:  Symmetrical without gross deformities. Normal posture. Extremities:  Without clubbing or edema. Neurologic:  Alert and  oriented x4;  grossly normal neurologically. Skin:  Intact without significant lesions or rashes. Cervical Nodes:  No significant cervical adenopathy. Psych:  Alert and cooperative. Normal mood and affect.  Impression/Plan: Stacey Mcneil is here for an EGD with possible dilation due to GERD, dysphagia, epigastric pain.   The risks of the procedure including infection, bleed, or perforation as well as benefits, limitations, alternatives and imponderables have been reviewed with the patient. Questions have been answered. All parties agreeable.

## 2020-11-13 LAB — SURGICAL PATHOLOGY

## 2020-11-13 NOTE — Anesthesia Postprocedure Evaluation (Signed)
Anesthesia Post Note  Patient: Stacey Mcneil  Procedure(s) Performed: ESOPHAGOGASTRODUODENOSCOPY (EGD) WITH PROPOFOL BALLOON DILATION BIOPSY  Patient location during evaluation: PACU Anesthesia Type: General Level of consciousness: awake and alert Pain management: pain level controlled Vital Signs Assessment: post-procedure vital signs reviewed and stable Respiratory status: spontaneous breathing, nonlabored ventilation, respiratory function stable and patient connected to nasal cannula oxygen Cardiovascular status: blood pressure returned to baseline and stable Postop Assessment: no apparent nausea or vomiting Anesthetic complications: no   No notable events documented.   Last Vitals:  Vitals:   11/12/20 0908 11/12/20 0915  BP: 121/60 136/73  Resp: (!) 25   Temp: 36.5 C   SpO2: 98%     Last Pain:  Vitals:   11/12/20 0915  TempSrc:   PainSc: 0-No pain                 Nicanor Alcon

## 2020-11-16 ENCOUNTER — Encounter: Payer: Self-pay | Admitting: *Deleted

## 2020-11-20 ENCOUNTER — Encounter (HOSPITAL_COMMUNITY): Payer: Self-pay | Admitting: Internal Medicine

## 2021-03-04 NOTE — Progress Notes (Deleted)
Primary Care Physician: [facility]  Primary Gastroenterologist:  Elon Alas. Abbey Chatters, DO   No chief complaint on file.   HPI: Stacey Mcneil is a 70 y.o. female here for follow up. She has h/o intermittent epigastric pain, dysphagia.   EGD 11/2020: -Multiple gastric polyps, hyperplastic -Gastritis. Biopsied-slight chronic inflammation. No h.pylori. -Normal duodenal bulb, first portion of the duodenum and second portion of the duodenum.  Colonoscopy 01/2016, [facility]: -perianal skin tag -diverticulosis -nonbleeding internal hemorrhoids -quality of prep good -repeat colonoscopy in 10 years.   Current Outpatient Medications  Medication Sig Dispense Refill   acetaminophen (TYLENOL) 650 MG CR tablet Take 650 mg by mouth every 8 (eight) hours as needed for pain.     atorvastatin (LIPITOR) 10 MG tablet Take 10 mg by mouth every Monday, Wednesday, and Friday.     cetirizine (ZYRTEC) 10 MG tablet Take 10 mg by mouth daily as needed for allergies.     Cholecalciferol 25 MCG (1000 UT) capsule Take 1,000 Units by mouth daily.     Cyanocobalamin (B-12) 500 MCG TABS Take 500 mcg by mouth daily.     hydrochlorothiazide (HYDRODIURIL) 25 MG tablet Take 25 mg by mouth daily.  5   lisinopril (PRINIVIL,ZESTRIL) 10 MG tablet Take 10 mg by mouth daily.   5   metFORMIN (GLUCOPHAGE) 500 MG tablet Take 500 mg by mouth daily.     metoprolol succinate (TOPROL-XL) 50 MG 24 hr tablet Take 50 mg by mouth daily.  0   Multiple Vitamins-Minerals (MULTIVITAMIN WITH MINERALS) tablet Take 1 tablet by mouth daily.     Omega-3 Fatty Acids (FISH OIL) 1200 MG CAPS Take 1,200 mg by mouth daily.      omeprazole (PRILOSEC) 40 MG capsule Take 40 mg by mouth daily.  5   No current facility-administered medications for this visit.    Allergies as of 03/05/2021   (No Known Allergies)    ROS:  General: Negative for anorexia, weight loss, fever, chills, fatigue,  weakness. ENT: Negative for hoarseness, difficulty swallowing , nasal congestion. CV: Negative for chest pain, angina, palpitations, dyspnea on exertion, peripheral edema.  Respiratory: Negative for dyspnea at rest, dyspnea on exertion, cough, sputum, wheezing.  GI: See history of present illness. GU:  Negative for dysuria, hematuria, urinary incontinence, urinary frequency, nocturnal urination.  Endo: Negative for unusual weight change.    Physical Examination:   There were no vitals taken for this visit.  General: Well-nourished, well-developed in no acute distress.  Eyes: No icterus. Mouth: Oropharyngeal mucosa moist and pink , no lesions erythema or exudate. Lungs: Clear to auscultation bilaterally.  Heart: Regular rate and rhythm, no murmurs rubs or gallops.  Abdomen: Bowel sounds are normal, nontender, nondistended, no hepatosplenomegaly or masses, no abdominal bruits or hernia , no rebound or guarding.   Extremities: No lower extremity edema. No clubbing or deformities. Neuro: Alert and oriented x 4   Skin: Warm and dry, no jaundice.   Psych: Alert and cooperative, normal mood and affect.  Labs:  Lab Results  Component Value Date   CREATININE 1.09 (H) 11/08/2020   BUN 24 (H) 11/08/2020   NA 137 11/08/2020   K 3.5 11/08/2020   CL 104 11/08/2020   CO2 28 11/08/2020   Lab Results  Component Value Date   WBC 4.8 10/04/2020   HGB 12.1 10/04/2020   HCT 38.7 10/04/2020   MCV 89.6 10/04/2020   PLT 208 10/04/2020  Lab Results  Component Value Date   IRON 86 10/04/2020   TIBC 332 10/04/2020   FERRITIN 434 (H) 10/04/2020     Imaging Studies: No results found.   Assessment:     Plan:

## 2021-03-05 ENCOUNTER — Ambulatory Visit: Payer: Medicare HMO | Admitting: Gastroenterology

## 2021-04-17 ENCOUNTER — Encounter: Payer: Self-pay | Admitting: Gastroenterology

## 2021-04-23 ENCOUNTER — Other Ambulatory Visit (HOSPITAL_COMMUNITY): Payer: Self-pay | Admitting: Family Medicine

## 2021-04-23 DIAGNOSIS — Z1231 Encounter for screening mammogram for malignant neoplasm of breast: Secondary | ICD-10-CM

## 2021-05-22 ENCOUNTER — Ambulatory Visit (HOSPITAL_COMMUNITY): Payer: Medicare HMO

## 2021-05-23 ENCOUNTER — Ambulatory Visit (HOSPITAL_COMMUNITY)
Admission: RE | Admit: 2021-05-23 | Discharge: 2021-05-23 | Disposition: A | Discharge status: Home / Self Care | Payer: Medicare HMO | Source: Ambulatory Visit | Attending: Family Medicine | Admitting: Family Medicine

## 2021-05-23 ENCOUNTER — Other Ambulatory Visit: Payer: Self-pay

## 2021-05-23 DIAGNOSIS — Z1231 Encounter for screening mammogram for malignant neoplasm of breast: Secondary | ICD-10-CM | POA: Insufficient documentation

## 2021-07-12 ENCOUNTER — Ambulatory Visit: Payer: Medicare HMO | Admitting: Gastroenterology

## 2021-08-03 ENCOUNTER — Encounter (HOSPITAL_COMMUNITY): Payer: Self-pay

## 2021-08-03 ENCOUNTER — Other Ambulatory Visit: Payer: Self-pay

## 2021-08-03 ENCOUNTER — Emergency Department (HOSPITAL_COMMUNITY)
Admission: EM | Admit: 2021-08-03 | Discharge: 2021-08-03 | Disposition: A | Discharge status: Home / Self Care | Payer: Medicare PPO | Attending: Emergency Medicine | Admitting: Emergency Medicine

## 2021-08-03 ENCOUNTER — Emergency Department (HOSPITAL_COMMUNITY): Payer: Medicare PPO

## 2021-08-03 DIAGNOSIS — M7731 Calcaneal spur, right foot: Secondary | ICD-10-CM | POA: Diagnosis not present

## 2021-08-03 DIAGNOSIS — M79671 Pain in right foot: Secondary | ICD-10-CM | POA: Diagnosis present

## 2021-08-03 MED ORDER — MELOXICAM 7.5 MG PO TABS: 7.5000 mg | ORAL_TABLET | Freq: Every day | ORAL | 0 refills | Status: DC

## 2021-08-03 NOTE — ED Provider Notes (Signed)
?[facility] ?Provider Note ? ? ?CSN: [id number] ?Arrival date & time: 08/03/21  9030 ? ?  ? ?History ? ?Chief Complaint  ?Patient presents with  ? Foot Pain  ? ? ?Stacey Mcneil is a 71 y.o. female. ? ?Patient presents to the emergency department for evaluation of foot pain.  Patient reports that the pain began yesterday.  She is uncertain what has caused the pain, denies any injury.  Pain is sharp and hurts when she tries to put weight on the foot. ? ? ?  ? ?Home Medications ?Prior to Admission medications   ?Medication Sig Start Date End Date Taking? Authorizing Provider  ?meloxicam (MOBIC) 7.5 MG tablet Take 1 tablet (7.5 mg total) by mouth daily. 08/03/21  Yes Jerrelle Michelsen, Gwenyth Allegra, MD  ?acetaminophen (TYLENOL) 650 MG CR tablet Take 650 mg by mouth every 8 (eight) hours as needed for pain.    [provider]  ?atorvastatin (LIPITOR) 10 MG tablet Take 10 mg by mouth every Monday, Wednesday, and Friday.    [provider]  ?cetirizine (ZYRTEC) 10 MG tablet Take 10 mg by mouth daily as needed for allergies.    [provider]  ?Cyanocobalamin (B-12) 500 MCG TABS Take 500 mcg by mouth daily.    [provider]  ?hydrochlorothiazide (HYDRODIURIL) 25 MG tablet Take 25 mg by mouth daily. 12/15/16   [provider]  ?lisinopril (PRINIVIL,ZESTRIL) 10 MG tablet Take 10 mg by mouth daily.  12/15/16   [provider]  ?metFORMIN (GLUCOPHAGE) 500 MG tablet Take 500 mg by mouth daily.    [provider]  ?metoprolol succinate (TOPROL-XL) 50 MG 24 hr tablet Take 50 mg by mouth daily. 12/15/16   [provider]  ?Multiple Vitamins-Minerals (MULTIVITAMIN WITH MINERALS) tablet Take 1 tablet by mouth daily.    [provider]  ?Omega-3 Fatty Acids (FISH OIL) 1200 MG CAPS Take 1,200 mg by mouth daily.     [provider]  ?omeprazole (PRILOSEC) 40 MG capsule Take 40 mg by mouth daily. 12/15/16   [provider]  ?    ? ?Allergies    ?Patient has no known allergies.   ? ?Review of Systems   ?Review of Systems ? ?Physical Exam ?Updated Vital Signs ?BP 128/64 (BP Location: Left Arm)   Pulse 64   Temp 98.4 ?F (36.9 ?C) (Oral)   Resp 17   Ht '5\' 1"'$  (1.549 m)   Wt 111.1 kg   SpO2 100%   BMI 46.29 kg/m?  ?Physical Exam ?Constitutional:   ?   Appearance: Normal appearance.  ?Cardiovascular:  ?   Pulses:     ?     Dorsalis pedis pulses are 1+ on the right side.  ?Musculoskeletal:  ?   Right foot: Normal range of motion. No deformity.  ?     Feet: ? ?Feet:  ?   Right foot:  ?   Skin integrity: Skin integrity normal. No ulcer, skin breakdown, erythema or warmth.  ?Skin: ?   General: Skin is warm and dry.  ?   Findings: No abscess, bruising, erythema, signs of injury, lesion, rash or wound.  ?Neurological:  ?   Mental Status: She is alert.  ?   Sensory: Sensation is intact.  ?   Motor: Motor function is intact.  ? ? ?ED Results / Procedures / Treatments   ?Labs ?(all labs ordered are listed, but only abnormal results are displayed) ?Labs Reviewed - No data to display ? ?  EKG ?None ? ?Radiology ?DG Foot Complete Right ? ?Result Date: 08/03/2021 ?CLINICAL DATA:  Right foot pain started yesterday. EXAM: RIGHT FOOT COMPLETE - 3+ VIEW COMPARISON:  None. FINDINGS: Three view study. There is no evidence of fracture or dislocation. There is no evidence of arthropathy or other focal bone abnormality. Soft tissues are unremarkable. IMPRESSION: Negative. Electronically Signed   By: Misty Stanley M.D.   On: 08/03/2021 06:15   ? ?Procedures ?Procedures  ? ? ?Medications Ordered in ED ?Medications - No data to display ? ?ED Course/ Medical Decision Making/ A&P ?  ?                        ?Medical Decision Making ?Amount and/or Complexity of Data Reviewed ?Radiology: ordered. ? ? ?Patient presents with foot pain.  Patient indicates pain at the posterior aspect of the calcaneus, at the area where the Achilles tendon inserts.  Patient has not had any  injury to the area.  Achilles tendon intact.  No overlying erythema, warmth or swelling.  Neurovascularly intact.  X-ray performed.  Images independently interpreted by myself.  There are prominent calcaneal spurs present that likely explain the patient's pain.  We will treat with rest and anti-inflammatory medication. ? ? ? ? ? ? ? ?Final Clinical Impression(s) / ED Diagnoses ?Final diagnoses:  ?Heel spur, right  ? ? ?Rx / DC Orders ?ED Discharge Orders   ? ?      Ordered  ?  meloxicam (MOBIC) 7.5 MG tablet  Daily       ? 08/03/21 3419  ? ?  ?  ? ?  ? ? ?  ?Orpah Greek, MD ?08/03/21 [phone] ? ?

## 2021-08-03 NOTE — ED Triage Notes (Signed)
Pt arrived pov with c/o pain right foot pain that started yesterday. States that no injury occurred. Says that pain is sharp and hurts when any pressure is applied.  ?

## 2021-08-09 ENCOUNTER — Ambulatory Visit: Payer: Medicare PPO | Admitting: Gastroenterology

## 2021-08-09 ENCOUNTER — Encounter: Payer: Self-pay | Admitting: Gastroenterology

## 2021-08-09 VITALS — BP 124/62 | HR 57 | Temp 97.1°F | Ht 61.5 in | Wt 248.4 lb

## 2021-08-09 DIAGNOSIS — K219 Gastro-esophageal reflux disease without esophagitis: Secondary | ICD-10-CM

## 2021-08-09 DIAGNOSIS — R1013 Epigastric pain: Secondary | ICD-10-CM | POA: Diagnosis not present

## 2021-08-09 DIAGNOSIS — R011 Cardiac murmur, unspecified: Secondary | ICD-10-CM

## 2021-08-09 NOTE — Patient Instructions (Addendum)
Continue omeprazole '40mg'$  before a meal once daily. ?Follow guidelines for reflux as outlined below. ?Referral to cardiology regarding heart murmur. ?Return to the office in one year or call sooner if needed.  ? ? ?Lifestyle and home remedies TO MANAGE REFLUX/HEARTBURN ?  ?  ?You may eliminate or reduce the frequency of heartburn by making the following lifestyle changes: ?  ?Control your weight. Being overweight is a major risk factor for heartburn and GERD. Excess pounds put pressure on your abdomen, pushing up your stomach and causing acid to back up into your esophagus.  ?  ?Eat smaller meals. 4 TO 6 MEALS A DAY. This reduces pressure on the lower esophageal sphincter, helping to prevent the valve from opening and acid from washing back into your esophagus. ?  ?  ?Loosen your belt. Clothes that fit tightly around your waist put pressure on your abdomen and the lower esophageal sphincter. ?  ?  ?Eliminate heartburn triggers. Everyone has specific triggers. Common triggers such as fatty or fried foods, spicy food, tomato sauce, carbonated beverages, alcohol, chocolate, mint, garlic, onion, caffeine and nicotine may make heartburn worse.  ?  ?Avoid stooping or bending. Tying your shoes is OK. Bending over for longer periods to weed your garden isn't, especially soon after eating.  ?  ?Don't lie down after a meal. Wait at least three to four hours after eating before going to bed, and don't lie down right after eating.  ?  ?  ?At [facility] we value your feedback. You may receive a survey about your visit today. Please share your experience as we strive to create trusting relationships with our patients to provide genuine, compassionate, quality care. ?  ?

## 2021-08-09 NOTE — Progress Notes (Signed)
? ? ? ?GI Office Note   ? ?Referring Provider: [facility]* ?Primary Care Physician:  [facility]  ?Primary Gastroenterologist: Elon Alas. Abbey Chatters, DO ? ? ?Chief Complaint  ? ?Chief Complaint  ?Patient presents with  ? Follow-up  ?  States that she still has abdominal pain every now and then. States that she notices it when she eats spicy foods. Currently on omeprazole 40 mg once daily.   ? ? ?History of Present Illness  ? ?Stacey Mcneil is a 71 y.o. female presenting today for follow-up.  Last seen in July 2022.  She has a history of dyspepsia, dysphagia. ? ?Since her last office visit she completed an EGD in August 2022 as outlined below.  She continues on omeprazole 40 mg daily.  The most part her symptoms are fairly well controlled.  Dysphagia resolved.  Occasional upper abdominal pain especially if she eats certain foods such as spicy foods.  She notes that she does not take her medication at consistent times, sometimes takes in the morning but other times takes in the evenings before bed.  She admits that she does have some room for improvement regarding her lifestyle modifications and diet.  She does not sleep well and tends to stay up into the early morning hours and often does snack during this time.  Tends to like to eat junk food such as chips etc. at this time.  Says she notes increased stress seems to aggravate her stomach.  She has a special-needs nephew and sister has had prior stroke with been living with her for several years which can be challenging.  Sarns bowel movements she has a BM every day.  Stools tend to be loose on metformin.  Denies any constipation, melena, rectal bleeding. ?  ?Recently has had right foot pain, seen in the ED.  X-ray unremarkable.  She was put in a boot.  She has been given 2-week course of Mobic and has an appointment with a podiatrist pending.   ? ?EGD August 2022: ?- Multiple gastric polyps.  Hyperplastic gastric polyps. ?-  Gastritis. Biopsied.  Slight chronic inflammation.  No H. pylori. ?- Normal duodenal bulb, first portion of the duodenum and second portion of the ?duodenum. ? ?Last colonoscopy in [facility], dated October 2017, perianal skin tag found on the perianal area, diverticulosis, nonbleeding internal hemorrhoids. Quality of the prep was good.  Repeat colonoscopy in 10 years. ? ? ?Medications  ? ?Current Outpatient Medications  ?Medication Sig Dispense Refill  ? acetaminophen (TYLENOL) 650 MG CR tablet Take 650 mg by mouth every 8 (eight) hours as needed for pain.    ? atorvastatin (LIPITOR) 10 MG tablet Take 10 mg by mouth daily.    ? Cyanocobalamin (B-12) 500 MCG TABS Take 500 mcg by mouth daily.    ? hydrochlorothiazide (HYDRODIURIL) 25 MG tablet Take 25 mg by mouth daily.  5  ? lisinopril (PRINIVIL,ZESTRIL) 10 MG tablet Take 10 mg by mouth daily.   5  ? meloxicam (MOBIC) 7.5 MG tablet Take 1 tablet (7.5 mg total) by mouth daily. 14 tablet 0  ? metFORMIN (GLUCOPHAGE) 500 MG tablet Take 500 mg by mouth daily.    ? metoprolol succinate (TOPROL-XL) 50 MG 24 hr tablet Take 50 mg by mouth daily.  0  ? Multiple Vitamins-Minerals (MULTIVITAMIN WITH MINERALS) tablet Take 1 tablet by mouth daily.    ? Omega-3 Fatty Acids (FISH OIL) 1200 MG CAPS Take 1,200 mg  by mouth daily.     ? omeprazole (PRILOSEC) 40 MG capsule Take 40 mg by mouth daily.  5  ? ?No current facility-administered medications for this visit.  ? ? ?Allergies  ? ?Allergies as of 08/09/2021  ? (No Known Allergies)  ? ?  ?Review of Systems  ? ?General: Negative for anorexia, weight loss, fever, chills, fatigue, weakness. ?ENT: Negative for hoarseness, difficulty swallowing , nasal congestion. ?CV: Negative for chest pain, angina, palpitations, dyspnea on exertion, peripheral edema.  ?Respiratory: Negative for dyspnea at rest, dyspnea on exertion, cough, sputum, wheezing.  ?GI: See history of present illness. ?GU:  Negative for dysuria, hematuria, urinary  incontinence, urinary frequency, nocturnal urination.  ?Endo: Negative for unusual weight change.  ?   ?Physical Exam  ? ?BP 124/62 (BP Location: Right Arm, Patient Position: Sitting, Cuff Size: Large)   Pulse (!) 57   Temp (!) 97.1 ?F (36.2 ?C) (Temporal)   Ht 5' 1.5" (1.562 m)   Wt 248 lb 6.4 oz (112.7 kg)   SpO2 100%   BMI 46.17 kg/m?  ?  ?General: Well-nourished, well-developed in no acute distress.  ?Eyes: No icterus. ?Mouth: Oropharyngeal mucosa moist and pink , no lesions erythema or exudate. ?Lungs: Clear to auscultation bilaterally.  ?Heart: Regular rate and rhythm, no rubs or gallops.  Harsh systolic ejection murmur heard. ?Abdomen: Bowel sounds are normal, nontender, nondistended, no hepatosplenomegaly or masses,  ?no abdominal bruits or hernia , no rebound or guarding.  ?Rectal: Not performed ?Extremities: No lower extremity edema. No clubbing or deformities. ?Neuro: Alert and oriented x 4   ?Skin: Warm and dry, no jaundice.   ?Psych: Alert and cooperative, normal mood and affect. ? ?Labs  ?  ?Labs from April 2023: Hemoglobin 11.5, hematocrit 36.9, MCV 87.9, platelets 206,000, white blood cell count 5100, albumin 3.8, BUN 19, creatinine 0.99, glucose 93 ? ? ?Imaging Studies  ? ?DG Foot Complete Right ? ?Result Date: 08/03/2021 ?CLINICAL DATA:  Right foot pain started yesterday. EXAM: RIGHT FOOT COMPLETE - 3+ VIEW COMPARISON:  None. FINDINGS: Three view study. There is no evidence of fracture or dislocation. There is no evidence of arthropathy or other focal bone abnormality. Soft tissues are unremarkable. IMPRESSION: Negative. Electronically Signed   By: Misty Stanley M.D.   On: 08/03/2021 06:15   ? ?Assessment  ? ?Dyspepsia/GERD/dysphagia: Patient is doing reasonably well.  Swallowing issues resolved.  Intermittent upper abdominal discomfort appears to be food related.  Patient admits to late night eating of "junk food".  She believes she has room for improvement with her diet.  We discussed  potentially changing PPI therapy for now her symptoms are fairly infrequent so we mutually agreed to continue omeprazole 40 mg daily.  We encouraged her to be more consistent with timing of her medication, typically taking before breakfast would be most effective unless she notes she is having predominantly nocturnal symptoms at which time would encourage her to take before her evening meal. ? ?Heart murmur: Harsh systolic ejection murmur heard on exam today.  Patient states she only recently was advised that she had a heart murmur.  She saw her cardiology for other issues years ago.  Appears to be fairly asymptomatic but would advise evaluation. ? ? ?PLAN  ? ?Cardiology referral for heart murmur. ?Continue omeprazole 40 mg once daily before a meal. ?Avoid food triggers. ?Reiterated antireflux measures, handout provided. ?Return to the office in 1 year or sooner if needed. ? ? ?Laureen Ochs. Nizhoni Parlow, MHS, PA-C ?[facility] ? ?

## 2021-09-20 ENCOUNTER — Ambulatory Visit: Payer: Medicare PPO | Admitting: Cardiology

## 2021-09-20 NOTE — Progress Notes (Deleted)
Clinical Summary Ms. Stacey Mcneil is a 71 y.o.female seen today as a new consult, referred by PA Lewis for the following medical problems.   1.Heart murmur  Past Medical History:  Diagnosis Date   Anemia    Chronic kidney disease    Diabetes mellitus without complication (HCC)    Hypertension      No Known Allergies   Current Outpatient Medications  Medication Sig Dispense Refill   acetaminophen (TYLENOL) 650 MG CR tablet Take 650 mg by mouth every 8 (eight) hours as needed for pain.     atorvastatin (LIPITOR) 10 MG tablet Take 10 mg by mouth daily.     Cyanocobalamin (B-12) 500 MCG TABS Take 500 mcg by mouth daily.     hydrochlorothiazide (HYDRODIURIL) 25 MG tablet Take 25 mg by mouth daily.  5   lisinopril (PRINIVIL,ZESTRIL) 10 MG tablet Take 10 mg by mouth daily.   5   meloxicam (MOBIC) 7.5 MG tablet Take 1 tablet (7.5 mg total) by mouth daily. 14 tablet 0   metFORMIN (GLUCOPHAGE) 500 MG tablet Take 500 mg by mouth daily.     metoprolol succinate (TOPROL-XL) 50 MG 24 hr tablet Take 50 mg by mouth daily.  0   Multiple Vitamins-Minerals (MULTIVITAMIN WITH MINERALS) tablet Take 1 tablet by mouth daily.     Omega-3 Fatty Acids (FISH OIL) 1200 MG CAPS Take 1,200 mg by mouth daily.      omeprazole (PRILOSEC) 40 MG capsule Take 40 mg by mouth daily.  5   No current facility-administered medications for this visit.     Past Surgical History:  Procedure Laterality Date   BALLOON DILATION N/A 11/12/2020   Procedure: BALLOON DILATION;  Surgeon: Eloise Harman, DO;  [facility];  Service: Endoscopy;  Laterality: N/A;   BIOPSY  11/12/2020   Procedure: BIOPSY;  Surgeon: Eloise Harman, DO;  [facility];  Service: Endoscopy;;   COLONOSCOPY  01/2016   [facility] healthcare: Perianal skin tag found in the perianal area, diverticulosis, nonbleeding internal hemorrhoids.  Quality of prep was good.  Next colonoscopy 10 years   ESOPHAGOGASTRODUODENOSCOPY N/A 01/20/2017    Fields: Mild Schatzki ring status post dilation, gastritis (benign pathology with no H. 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)   ESOPHAGOGASTRODUODENOSCOPY (EGD) WITH PROPOFOL N/A 11/12/2020   Procedure: ESOPHAGOGASTRODUODENOSCOPY (EGD) WITH PROPOFOL;  Surgeon: Eloise Harman, DO;  [facility];  Service: Endoscopy;  Laterality: N/A;  9:30am   SAVORY DILATION N/A 01/20/2017   Procedure: SAVORY DILATION;  Surgeon: Danie Binder, MD;  [facility];  Service: Endoscopy;  Laterality: N/A;     No Known Allergies    Family History  Problem Relation Age of Onset   Congestive Heart Failure Father    Stroke Mother    Leukemia Brother    Heart attack Sister    Hypertension Sister    Diabetes Sister    Other Brother        double pneumonia; paralyzed   Kidney disease Brother    Diabetes Brother    Hypertension Brother    Other Brother        on dialysis   Hypertension Sister    Diabetes Sister    Hypertension Sister    Diabetes Sister    Stroke Sister    Hypertension Daughter    Hypertension Daughter    Colon cancer Neg Hx    Colon polyps Neg  Hx      Social History Ms. Stacey Mcneil reports that she has never smoked. She has never used smokeless tobacco. Ms. Stacey Mcneil reports no history of alcohol use.   Review of Systems CONSTITUTIONAL: No weight loss, fever, chills, weakness or fatigue.  HEENT: Eyes: No visual loss, blurred vision, double vision or yellow sclerae.No hearing loss, sneezing, congestion, runny nose or sore throat.  SKIN: No rash or itching.  CARDIOVASCULAR:  RESPIRATORY: No shortness of breath, cough or sputum.  GASTROINTESTINAL: No anorexia, nausea, vomiting or diarrhea. No abdominal pain or blood.  GENITOURINARY: No burning on urination, no polyuria NEUROLOGICAL: No headache, dizziness, syncope, paralysis, ataxia, numbness or tingling in the extremities. No change in bowel or bladder control.   MUSCULOSKELETAL: No muscle, back pain, joint pain or stiffness.  LYMPHATICS: No enlarged nodes. No history of splenectomy.  PSYCHIATRIC: No history of depression or anxiety.  ENDOCRINOLOGIC: No reports of sweating, cold or heat intolerance. No polyuria or polydipsia.  Marland Kitchen   Physical Examination There were no vitals filed for this visit. There were no vitals filed for this visit.  Gen: resting comfortably, no acute distress HEENT: no scleral icterus, pupils equal round and reactive, no palptable cervical adenopathy,  CV Resp: Clear to auscultation bilaterally GI: abdomen is soft, non-tender, non-distended, normal bowel sounds, no hepatosplenomegaly MSK: extremities are warm, no edema.  Skin: warm, no rash Neuro:  no focal deficits Psych: appropriate affect   Diagnostic Studies     Assessment and Plan        Arnoldo Lenis, M.D., F.A.C.C.

## 2021-10-31 ENCOUNTER — Ambulatory Visit: Payer: Medicare PPO | Admitting: Cardiology

## 2021-10-31 NOTE — Progress Notes (Deleted)
Clinical Summary Stacey Mcneil is a 71 y.o.female seen as a new consult, referred by PA Leweis   for the following medical problems.  1.Heart murmur   Past Medical History:  Diagnosis Date   Anemia    Chronic kidney disease    Diabetes mellitus without complication (HCC)    Hypertension      No Known Allergies   Current Outpatient Medications  Medication Sig Dispense Refill   acetaminophen (TYLENOL) 650 MG CR tablet Take 650 mg by mouth every 8 (eight) hours as needed for pain.     atorvastatin (LIPITOR) 10 MG tablet Take 10 mg by mouth daily.     Cyanocobalamin (B-12) 500 MCG TABS Take 500 mcg by mouth daily.     hydrochlorothiazide (HYDRODIURIL) 25 MG tablet Take 25 mg by mouth daily.  5   lisinopril (PRINIVIL,ZESTRIL) 10 MG tablet Take 10 mg by mouth daily.   5   meloxicam (MOBIC) 7.5 MG tablet Take 1 tablet (7.5 mg total) by mouth daily. 14 tablet 0   metFORMIN (GLUCOPHAGE) 500 MG tablet Take 500 mg by mouth daily.     metoprolol succinate (TOPROL-XL) 50 MG 24 hr tablet Take 50 mg by mouth daily.  0   Multiple Vitamins-Minerals (MULTIVITAMIN WITH MINERALS) tablet Take 1 tablet by mouth daily.     Omega-3 Fatty Acids (FISH OIL) 1200 MG CAPS Take 1,200 mg by mouth daily.      omeprazole (PRILOSEC) 40 MG capsule Take 40 mg by mouth daily.  5   No current facility-administered medications for this visit.     Past Surgical History:  Procedure Laterality Date   BALLOON DILATION N/A 11/12/2020   Procedure: BALLOON DILATION;  Surgeon: Eloise Harman, DO;  [facility];  Service: Endoscopy;  Laterality: N/A;   BIOPSY  11/12/2020   Procedure: BIOPSY;  Surgeon: Eloise Harman, DO;  [facility];  Service: Endoscopy;;   COLONOSCOPY  01/2016   [facility] healthcare: Perianal skin tag found in the perianal area, diverticulosis, nonbleeding internal hemorrhoids.  Quality of prep was good.  Next colonoscopy 10 years   ESOPHAGOGASTRODUODENOSCOPY N/A 01/20/2017    Fields: Mild Schatzki ring status post dilation, gastritis (benign pathology with no H. 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)   ESOPHAGOGASTRODUODENOSCOPY (EGD) WITH PROPOFOL N/A 11/12/2020   Procedure: ESOPHAGOGASTRODUODENOSCOPY (EGD) WITH PROPOFOL;  Surgeon: Eloise Harman, DO;  [facility];  Service: Endoscopy;  Laterality: N/A;  9:30am   SAVORY DILATION N/A 01/20/2017   Procedure: SAVORY DILATION;  Surgeon: Danie Binder, MD;  [facility];  Service: Endoscopy;  Laterality: N/A;     No Known Allergies    Family History  Problem Relation Age of Onset   Congestive Heart Failure Father    Stroke Mother    Leukemia Brother    Heart attack Sister    Hypertension Sister    Diabetes Sister    Other Brother        double pneumonia; paralyzed   Kidney disease Brother    Diabetes Brother    Hypertension Brother    Other Brother        on dialysis   Hypertension Sister    Diabetes Sister    Hypertension Sister    Diabetes Sister    Stroke Sister    Hypertension Daughter    Hypertension Daughter    Colon cancer Neg Hx    Colon polyps  Neg Hx      Social History Ms. Chopp reports that she has never smoked. She has never used smokeless tobacco. Ms. Bergthold reports no history of alcohol use.   Review of Systems CONSTITUTIONAL: No weight loss, fever, chills, weakness or fatigue.  HEENT: Eyes: No visual loss, blurred vision, double vision or yellow sclerae.No hearing loss, sneezing, congestion, runny nose or sore throat.  SKIN: No rash or itching.  CARDIOVASCULAR:  RESPIRATORY: No shortness of breath, cough or sputum.  GASTROINTESTINAL: No anorexia, nausea, vomiting or diarrhea. No abdominal pain or blood.  GENITOURINARY: No burning on urination, no polyuria NEUROLOGICAL: No headache, dizziness, syncope, paralysis, ataxia, numbness or tingling in the extremities. No change in bowel or bladder control.  MUSCULOSKELETAL:  No muscle, back pain, joint pain or stiffness.  LYMPHATICS: No enlarged nodes. No history of splenectomy.  PSYCHIATRIC: No history of depression or anxiety.  ENDOCRINOLOGIC: No reports of sweating, cold or heat intolerance. No polyuria or polydipsia.  Marland Kitchen   Physical Examination There were no vitals filed for this visit. There were no vitals filed for this visit.  Gen: resting comfortably, no acute distress HEENT: no scleral icterus, pupils equal round and reactive, no palptable cervical adenopathy,  CV Resp: Clear to auscultation bilaterally GI: abdomen is soft, non-tender, non-distended, normal bowel sounds, no hepatosplenomegaly MSK: extremities are warm, no edema.  Skin: warm, no rash Neuro:  no focal deficits Psych: appropriate affect   Diagnostic Studies     Assessment and Plan        Arnoldo Lenis, M.D., F.A.C.C.

## 2021-11-18 IMAGING — US US RENAL
1 series · 13 of 25 positions shown · non-contrast
Comparison: May 06, 2017

CLINICAL DATA: Acute renal injury and elevated creatinine.

EXAM:
RENAL / URINARY TRACT ULTRASOUND COMPLETE

[Series 1: us renal · 13 of 94 slices shown]
[im 1/94]
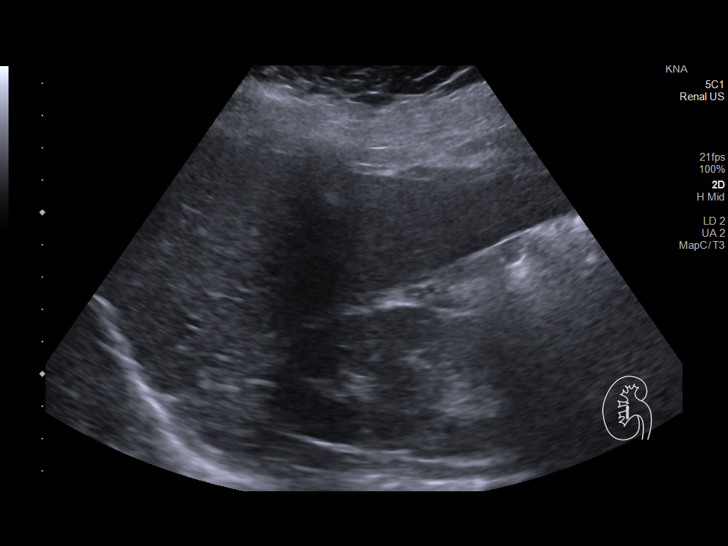
[im 8/94]
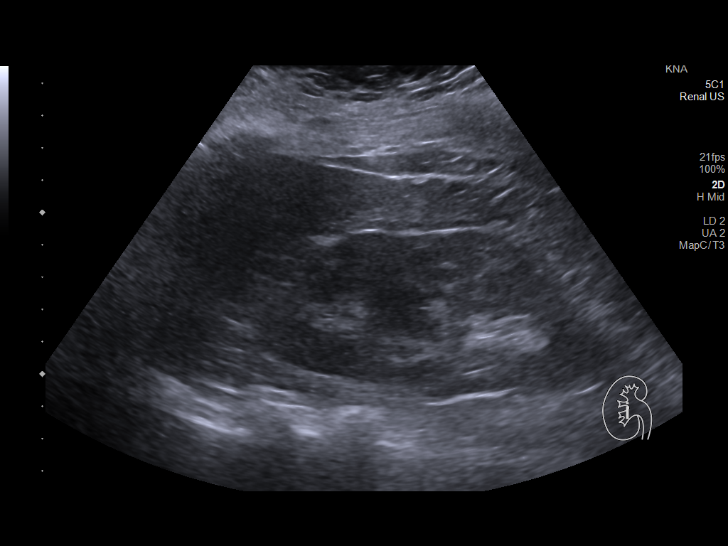
[im 16/94]
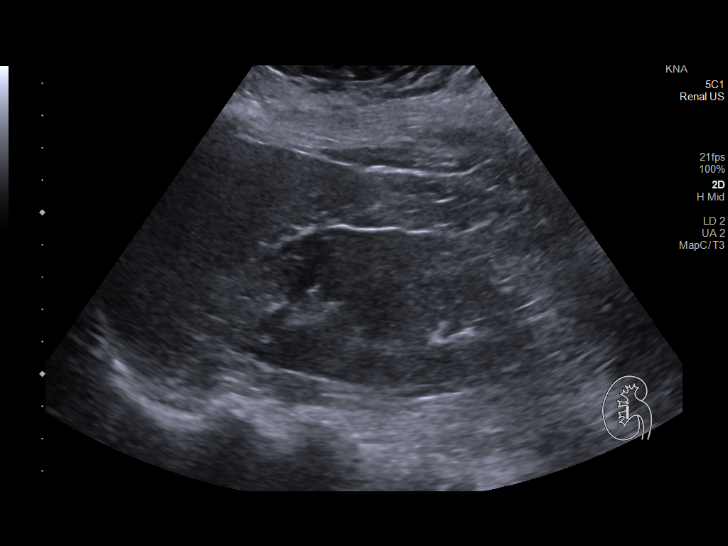
[im 24/94]
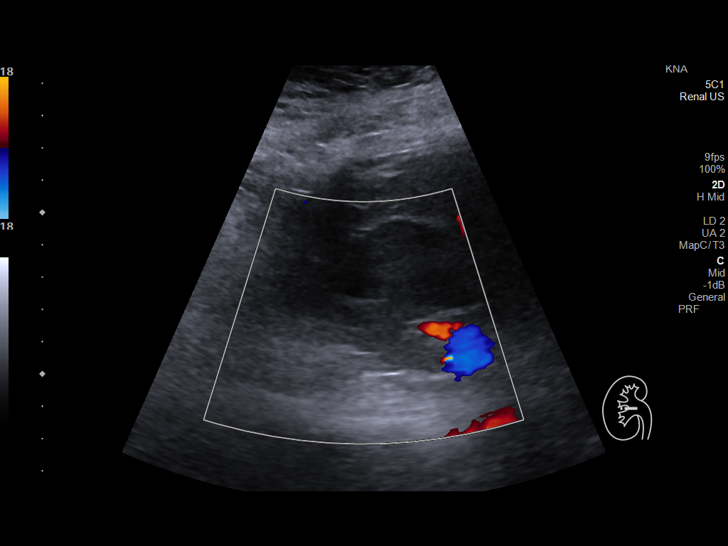
[im 32/94]
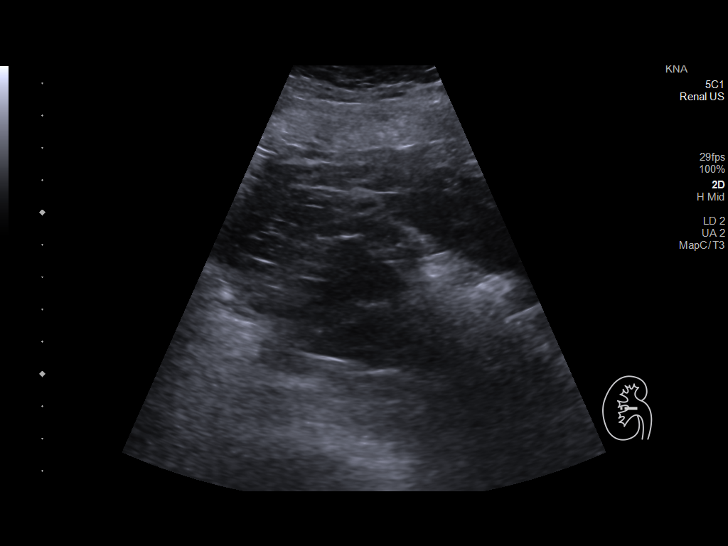
[im 39/94]
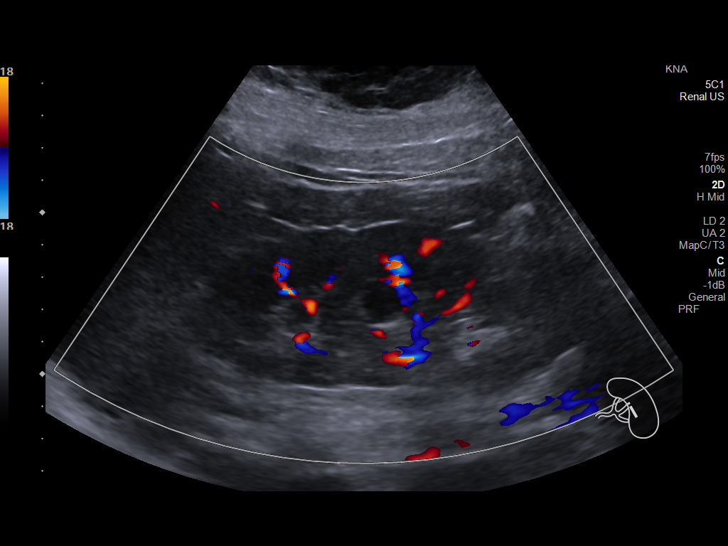
[im 47/94]
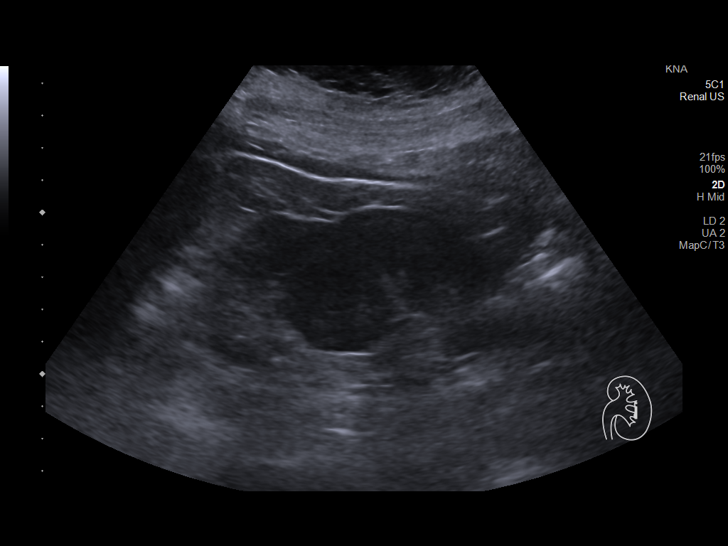
[im 55/94]
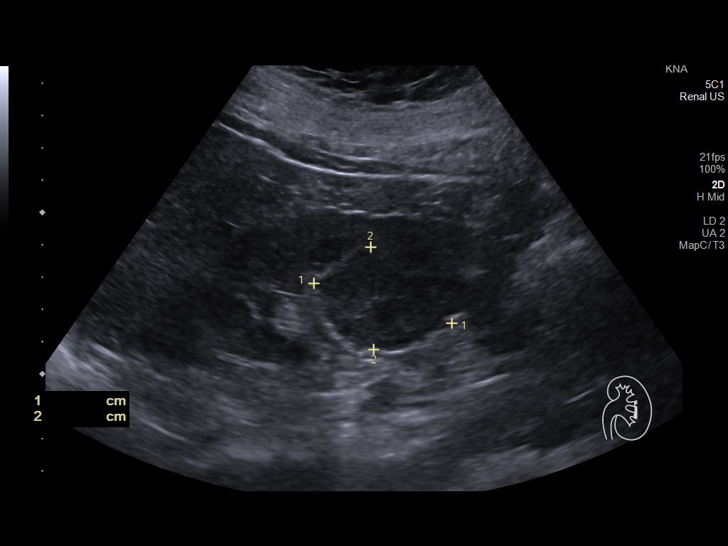
[im 63/94]
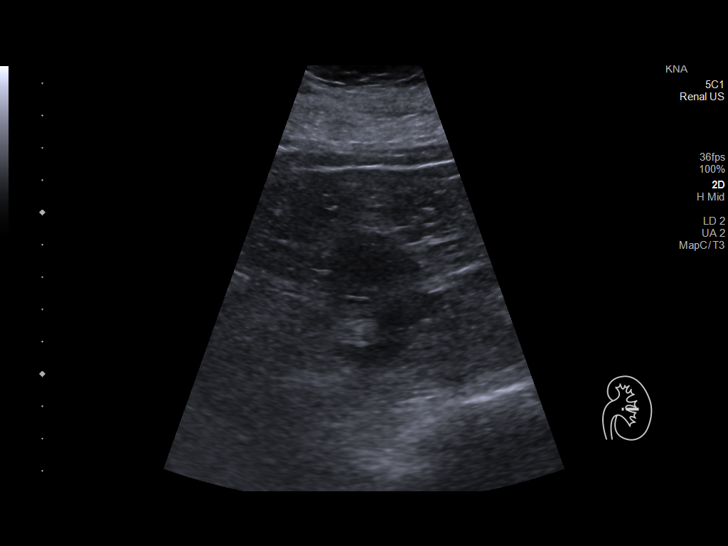
[im 70/94]
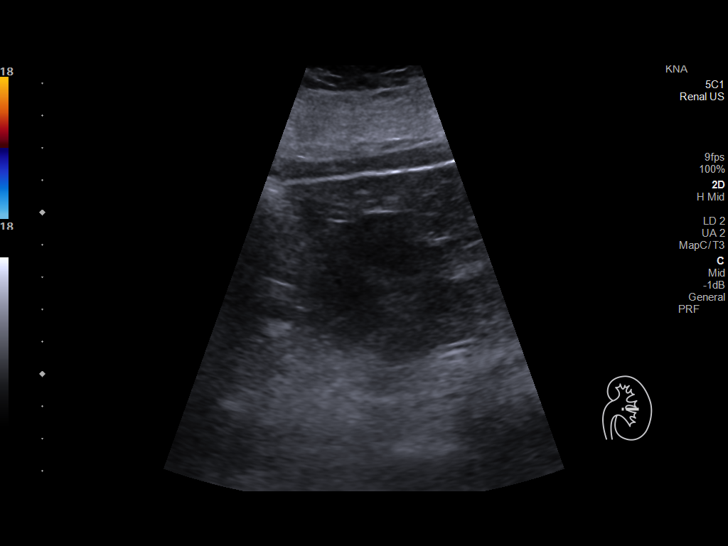
[im 78/94]
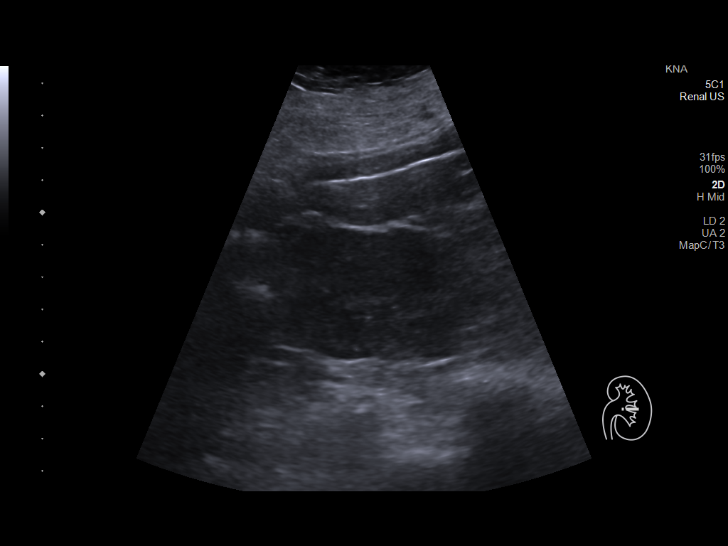
[im 86/94]
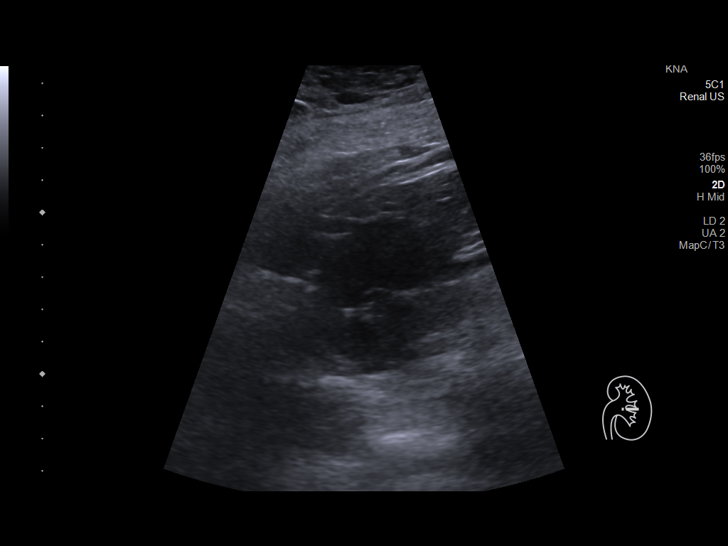
[im 94/94]
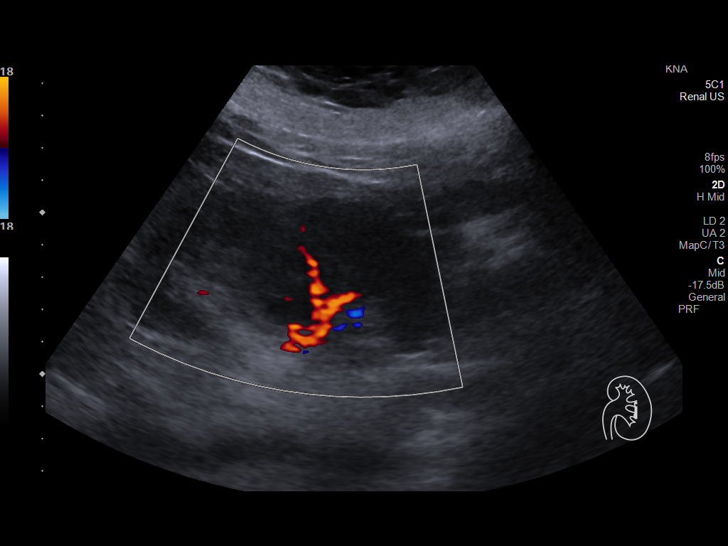

[13 of 25 positions shown; findings below may reference images not displayed]

FINDINGS: Right Kidney:

Renal measurements: 11.1 cm x 5.3 cm x 4.9 cm = volume: 150.6 mL.
Echogenicity within normal limits. A 1.6 cm x 1.3 cm x 1.5 cm,
well-defined anechoic structure is seen within the mid right kidney.
No abnormal flow is seen within this region on color Doppler
evaluation. No hydronephrosis is visualized.

Left Kidney:

Renal measurements: 10.6 cm x 5.2 cm x 4.7 cm = volume: 134.1 mL.
Echogenicity within normal limits. A 4.4 cm x 3.2 cm x 3.8 cm
isoechoic area is seen within the medial aspect of the mid to upper
left kidney. No abnormal flow is seen within this region on color
Doppler evaluation. No hydronephrosis is visualized.

Bladder:

Appears normal for degree of bladder distention. Bilateral ureteral
jets are visualized.

Other:

None.
IMPRESSION: 1. Findings within the mid left kidney which may represent a complex
renal cyst versus soft tissue mass. Further correlation with renal
protocol MRI or CT without and with contrast is recommended to
further exclude the presence of an underlying neoplasm.
2. Additional findings likely consistent with a small simple right
renal cyst.

## 2021-12-19 NOTE — Progress Notes (Unsigned)
Clinical Summary Ms. Clowdus is a 71 y.o.female seen as new patient, seen for the following medical problems  1.Heart murmur - has been told about heart murmur over the last several - does water aerobics 3-4 times a week, tolerates well - occasional LE edema.    2. Chest pain - cramping like pain left sided. Can occur at rest or with exertion - lasts about 1 minutes - can occur with eating.  Past Medical History:  Diagnosis Date   Anemia    Chronic kidney disease    Diabetes mellitus without complication (HCC)    Hypertension      No Known Allergies   Current Outpatient Medications  Medication Sig Dispense Refill   acetaminophen (TYLENOL) 650 MG CR tablet Take 650 mg by mouth every 8 (eight) hours as needed for pain.     atorvastatin (LIPITOR) 10 MG tablet Take 10 mg by mouth daily.     Cyanocobalamin (B-12) 500 MCG TABS Take 500 mcg by mouth daily.     hydrochlorothiazide (HYDRODIURIL) 25 MG tablet Take 25 mg by mouth daily.  5   lisinopril (PRINIVIL,ZESTRIL) 10 MG tablet Take 10 mg by mouth daily.   5   meloxicam (MOBIC) 7.5 MG tablet Take 1 tablet (7.5 mg total) by mouth daily. 14 tablet 0   metFORMIN (GLUCOPHAGE) 500 MG tablet Take 500 mg by mouth daily.     metoprolol succinate (TOPROL-XL) 50 MG 24 hr tablet Take 50 mg by mouth daily.  0   Multiple Vitamins-Minerals (MULTIVITAMIN WITH MINERALS) tablet Take 1 tablet by mouth daily.     Omega-3 Fatty Acids (FISH OIL) 1200 MG CAPS Take 1,200 mg by mouth daily.      omeprazole (PRILOSEC) 40 MG capsule Take 40 mg by mouth daily.  5   No current facility-administered medications for this visit.     Past Surgical History:  Procedure Laterality Date   BALLOON DILATION N/A 11/12/2020   Procedure: BALLOON DILATION;  Surgeon: Eloise Harman, DO;  [facility];  Service: Endoscopy;  Laterality: N/A;   BIOPSY  11/12/2020   Procedure: BIOPSY;  Surgeon: Eloise Harman, DO;  [facility];   Service: Endoscopy;;   COLONOSCOPY  01/2016   [facility] healthcare: Perianal skin tag found in the perianal area, diverticulosis, nonbleeding internal hemorrhoids.  Quality of prep was good.  Next colonoscopy 10 years   ESOPHAGOGASTRODUODENOSCOPY N/A 01/20/2017   Fields: Mild Schatzki ring status post dilation, gastritis (benign pathology with no H. 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)   ESOPHAGOGASTRODUODENOSCOPY (EGD) WITH PROPOFOL N/A 11/12/2020   Procedure: ESOPHAGOGASTRODUODENOSCOPY (EGD) WITH PROPOFOL;  Surgeon: Eloise Harman, DO;  [facility];  Service: Endoscopy;  Laterality: N/A;  9:30am   SAVORY DILATION N/A 01/20/2017   Procedure: SAVORY DILATION;  Surgeon: Danie Binder, MD;  [facility];  Service: Endoscopy;  Laterality: N/A;     No Known Allergies    Family History  Problem Relation Age of Onset   Congestive Heart Failure Father    Stroke Mother    Leukemia Brother    Heart attack Sister    Hypertension Sister    Diabetes Sister    Other Brother        double pneumonia; paralyzed   Kidney disease Brother    Diabetes Brother    Hypertension Brother    Other Brother  on dialysis   Hypertension Sister    Diabetes Sister    Hypertension Sister    Diabetes Sister    Stroke Sister    Hypertension Daughter    Hypertension Daughter    Colon cancer Neg Hx    Colon polyps Neg Hx      Social History Ms. Weigand reports that she has never smoked. She has never used smokeless tobacco. Ms. Reising reports no history of alcohol use.   Review of Systems CONSTITUTIONAL: No weight loss, fever, chills, weakness or fatigue.  HEENT: Eyes: No visual loss, blurred vision, double vision or yellow sclerae.No hearing loss, sneezing, congestion, runny nose or sore throat.  SKIN: No rash or itching.  CARDIOVASCULAR: per hpi RESPIRATORY: No shortness of breath, cough or sputum.  GASTROINTESTINAL: No anorexia, nausea,  vomiting or diarrhea. No abdominal pain or blood.  GENITOURINARY: No burning on urination, no polyuria NEUROLOGICAL: No headache, dizziness, syncope, paralysis, ataxia, numbness or tingling in the extremities. No change in bowel or bladder control.  MUSCULOSKELETAL: No muscle, back pain, joint pain or stiffness.  LYMPHATICS: No enlarged nodes. No history of splenectomy.  PSYCHIATRIC: No history of depression or anxiety.  ENDOCRINOLOGIC: No reports of sweating, cold or heat intolerance. No polyuria or polydipsia.  Marland Kitchen   Physical Examination Today's Vitals   12/20/21 0905  BP: 128/84  Pulse: (!) 58  SpO2: 100%  Weight: 246 lb 12.8 oz (111.9 kg)  Height: 5' 1.5" (1.562 m)   Body mass index is 45.88 kg/m.  Gen: resting comfortably, no acute distress HEENT: no scleral icterus, pupils equal round and reactive, no palptable cervical adenopathy,  CV: RRR, 3/6 systolic murmur rusb, +bilateral carotid bruits Resp: Clear to auscultation bilaterally GI: abdomen is soft, non-tender, non-distended, normal bowel sounds, no hepatosplenomegaly MSK: extremities are warm, no edema.  Skin: warm, no rash Neuro:  no focal deficits Psych: appropriate affect     Assessment and Plan  1.Heart murmur - exam suggestive of aortic stenosis/sclerosis, will obtain echo - no significant symptoms  2. Carotid bruits - obtain carotid US  3. Chest pain -noncardiac in description, no plans for stress testing at this time - EKG today SR, no ischemic changes    Arnoldo Lenis, M.D.

## 2021-12-20 ENCOUNTER — Ambulatory Visit: Payer: Medicare PPO | Attending: Cardiology | Admitting: Cardiology

## 2021-12-20 ENCOUNTER — Encounter: Payer: Self-pay | Admitting: Cardiology

## 2021-12-20 VITALS — BP 128/84 | HR 58 | Ht 61.5 in | Wt 246.8 lb

## 2021-12-20 DIAGNOSIS — R0789 Other chest pain: Secondary | ICD-10-CM | POA: Diagnosis not present

## 2021-12-20 DIAGNOSIS — R0989 Other specified symptoms and signs involving the circulatory and respiratory systems: Secondary | ICD-10-CM | POA: Diagnosis not present

## 2021-12-20 DIAGNOSIS — Z136 Encounter for screening for cardiovascular disorders: Secondary | ICD-10-CM

## 2021-12-20 DIAGNOSIS — R011 Cardiac murmur, unspecified: Secondary | ICD-10-CM | POA: Diagnosis not present

## 2021-12-20 NOTE — Patient Instructions (Addendum)
Medication Instructions:  Continue all current medications.     Labwork: none  Testing/Procedures: Your physician has requested that you have a carotid duplex. This test is an ultrasound of the carotid arteries in your neck. It looks at blood flow through these arteries that supply the brain with blood. Allow one hour for this exam. There are no restrictions or special instructions. Your physician has requested that you have an echocardiogram. Echocardiography is a painless test that uses sound waves to create images of your heart. It provides your doctor with information about the size and shape of your heart and how well your heart's chambers and valves are working. This procedure takes approximately one hour. There are no restrictions for this procedure. Office will contact with results via phone, letter or mychart.     Follow-Up: Pending   Any Other Special Instructions Will Be Listed Below (If Applicable).  If you need a refill on your cardiac medications before your next appointment, please call your pharmacy.

## 2021-12-26 ENCOUNTER — Ambulatory Visit: Payer: Medicare PPO | Attending: Cardiology

## 2021-12-26 ENCOUNTER — Ambulatory Visit (INDEPENDENT_AMBULATORY_CARE_PROVIDER_SITE_OTHER): Payer: Medicare PPO

## 2021-12-26 DIAGNOSIS — R011 Cardiac murmur, unspecified: Secondary | ICD-10-CM | POA: Diagnosis not present

## 2021-12-26 DIAGNOSIS — R0989 Other specified symptoms and signs involving the circulatory and respiratory systems: Secondary | ICD-10-CM | POA: Diagnosis not present

## 2021-12-26 LAB — ECHOCARDIOGRAM COMPLETE
AR max vel: 0.96 cm2
AV Area VTI: 1.07 cm2
AV Area mean vel: 1.02 cm2
AV Mean grad: 23 mmHg
AV Peak grad: 43.3 mmHg
Ao pk vel: 3.29 m/s
Area-P 1/2: 2.63 cm2
Calc EF: 83.3 %
MV M vel: 3.99 m/s
MV Peak grad: 63.7 mmHg
S' Lateral: 2.62 cm
Single Plane A2C EF: 82.8 %
Single Plane A4C EF: 84.9 %

## 2021-12-26 MED ORDER — PERFLUTREN LIPID MICROSPHERE
1.0000 mL | INTRAVENOUS | Status: AC | PRN
  Administered 2021-12-26: 2 mL via INTRAVENOUS

## 2022-01-03 ENCOUNTER — Telehealth: Payer: Self-pay | Admitting: *Deleted

## 2022-01-03 NOTE — Telephone Encounter (Signed)
Laurine Blazer, LPN  3/61/4431  5:40 PM EDT Back to Top    Patient notified via detailed voice message.  Copy to pcp.

## 2022-01-03 NOTE — Telephone Encounter (Signed)
-----   Message from Arnoldo Lenis, MD sent at 12/27/2021 12:32 PM EDT ----- Carotid US shows just mild plaque in the carotid arteries, just something to monitor at this time   Zandra Abts MD

## 2022-01-10 ENCOUNTER — Other Ambulatory Visit: Payer: Self-pay | Admitting: *Deleted

## 2022-01-10 ENCOUNTER — Telehealth: Payer: Self-pay | Admitting: Cardiology

## 2022-01-10 DIAGNOSIS — R011 Cardiac murmur, unspecified: Secondary | ICD-10-CM

## 2022-01-10 NOTE — Telephone Encounter (Signed)
° °  Pt is returning call to get echo result °

## 2022-01-10 NOTE — Telephone Encounter (Signed)
Laurine Blazer, LPN  28/06/1515  6:16 AM EDT Back to Top    Notified, copy to pcp.    Laurine Blazer, LPN  10/07/7104  2:69 PM EDT     Left message to return call.   Laurine Blazer, LPN  48/08/4625  0:35 PM EDT     Left message to return call.   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 This can happen with aging, the valve gets a little thicker and stiffer. Its not at a degree of concern but just something to monitor at this time, we would repeat an echo in 1 year. F/u with [place] 1 year.    Zandra Abts MD

## 2022-04-23 ENCOUNTER — Other Ambulatory Visit (HOSPITAL_COMMUNITY): Payer: Self-pay | Admitting: Family Medicine

## 2022-04-23 DIAGNOSIS — Z1231 Encounter for screening mammogram for malignant neoplasm of breast: Secondary | ICD-10-CM

## 2022-05-26 ENCOUNTER — Ambulatory Visit (HOSPITAL_COMMUNITY)
Admission: RE | Admit: 2022-05-26 | Discharge: 2022-05-26 | Disposition: A | Discharge status: Home / Self Care | Payer: Medicare PPO | Source: Ambulatory Visit | Attending: Family Medicine | Admitting: Family Medicine

## 2022-05-26 DIAGNOSIS — Z1231 Encounter for screening mammogram for malignant neoplasm of breast: Secondary | ICD-10-CM | POA: Diagnosis not present

## 2022-06-12 DIAGNOSIS — E1122 Type 2 diabetes mellitus with diabetic chronic kidney disease: Secondary | ICD-10-CM | POA: Diagnosis not present

## 2022-06-12 DIAGNOSIS — R809 Proteinuria, unspecified: Secondary | ICD-10-CM | POA: Diagnosis not present

## 2022-06-12 DIAGNOSIS — I5032 Chronic diastolic (congestive) heart failure: Secondary | ICD-10-CM | POA: Diagnosis not present

## 2022-06-12 DIAGNOSIS — I350 Nonrheumatic aortic (valve) stenosis: Secondary | ICD-10-CM | POA: Diagnosis not present

## 2022-06-12 DIAGNOSIS — Z6841 Body Mass Index (BMI) 40.0 and over, adult: Secondary | ICD-10-CM | POA: Diagnosis not present

## 2022-06-12 DIAGNOSIS — I129 Hypertensive chronic kidney disease with stage 1 through stage 4 chronic kidney disease, or unspecified chronic kidney disease: Secondary | ICD-10-CM | POA: Diagnosis not present

## 2022-06-12 DIAGNOSIS — E211 Secondary hyperparathyroidism, not elsewhere classified: Secondary | ICD-10-CM | POA: Diagnosis not present

## 2022-06-12 DIAGNOSIS — N189 Chronic kidney disease, unspecified: Secondary | ICD-10-CM | POA: Diagnosis not present

## 2022-06-12 DIAGNOSIS — E559 Vitamin D deficiency, unspecified: Secondary | ICD-10-CM | POA: Diagnosis not present

## 2022-06-23 DIAGNOSIS — Z7689 Persons encountering health services in other specified circumstances: Secondary | ICD-10-CM | POA: Diagnosis not present

## 2022-06-23 DIAGNOSIS — J302 Other seasonal allergic rhinitis: Secondary | ICD-10-CM | POA: Diagnosis not present

## 2022-06-23 DIAGNOSIS — Z6841 Body Mass Index (BMI) 40.0 and over, adult: Secondary | ICD-10-CM | POA: Diagnosis not present

## 2022-07-21 ENCOUNTER — Encounter: Payer: Self-pay | Admitting: Gastroenterology

## 2022-08-25 ENCOUNTER — Other Ambulatory Visit: Payer: Self-pay

## 2022-08-25 ENCOUNTER — Inpatient Hospital Stay (HOSPITAL_COMMUNITY)
Admission: EM | Admit: 2022-08-25 | Discharge: 2022-08-29 | DRG: 378 | Disposition: A | Discharge status: Home / Self Care | Payer: Medicare PPO | Attending: Family Medicine | Admitting: Family Medicine

## 2022-08-25 ENCOUNTER — Encounter (HOSPITAL_COMMUNITY): Payer: Self-pay | Admitting: *Deleted

## 2022-08-25 DIAGNOSIS — K5730 Diverticulosis of large intestine without perforation or abscess without bleeding: Secondary | ICD-10-CM | POA: Diagnosis not present

## 2022-08-25 DIAGNOSIS — I129 Hypertensive chronic kidney disease with stage 1 through stage 4 chronic kidney disease, or unspecified chronic kidney disease: Secondary | ICD-10-CM | POA: Diagnosis present

## 2022-08-25 DIAGNOSIS — K921 Melena: Secondary | ICD-10-CM | POA: Diagnosis not present

## 2022-08-25 DIAGNOSIS — K922 Gastrointestinal hemorrhage, unspecified: Secondary | ICD-10-CM | POA: Diagnosis present

## 2022-08-25 DIAGNOSIS — K5731 Diverticulosis of large intestine without perforation or abscess with bleeding: Secondary | ICD-10-CM | POA: Diagnosis present

## 2022-08-25 DIAGNOSIS — Z7984 Long term (current) use of oral hypoglycemic drugs: Secondary | ICD-10-CM

## 2022-08-25 DIAGNOSIS — I10 Essential (primary) hypertension: Secondary | ICD-10-CM

## 2022-08-25 DIAGNOSIS — E1122 Type 2 diabetes mellitus with diabetic chronic kidney disease: Secondary | ICD-10-CM | POA: Diagnosis present

## 2022-08-25 DIAGNOSIS — E119 Type 2 diabetes mellitus without complications: Secondary | ICD-10-CM | POA: Diagnosis not present

## 2022-08-25 DIAGNOSIS — Z79899 Other long term (current) drug therapy: Secondary | ICD-10-CM | POA: Diagnosis not present

## 2022-08-25 DIAGNOSIS — Z6841 Body Mass Index (BMI) 40.0 and over, adult: Secondary | ICD-10-CM

## 2022-08-25 DIAGNOSIS — D649 Anemia, unspecified: Secondary | ICD-10-CM | POA: Diagnosis present

## 2022-08-25 DIAGNOSIS — Z841 Family history of disorders of kidney and ureter: Secondary | ICD-10-CM | POA: Diagnosis not present

## 2022-08-25 DIAGNOSIS — K625 Hemorrhage of anus and rectum: Secondary | ICD-10-CM | POA: Diagnosis not present

## 2022-08-25 DIAGNOSIS — K219 Gastro-esophageal reflux disease without esophagitis: Secondary | ICD-10-CM | POA: Diagnosis present

## 2022-08-25 DIAGNOSIS — Z8249 Family history of ischemic heart disease and other diseases of the circulatory system: Secondary | ICD-10-CM

## 2022-08-25 DIAGNOSIS — N1831 Chronic kidney disease, stage 3a: Secondary | ICD-10-CM

## 2022-08-25 DIAGNOSIS — Z833 Family history of diabetes mellitus: Secondary | ICD-10-CM | POA: Diagnosis not present

## 2022-08-25 DIAGNOSIS — E876 Hypokalemia: Secondary | ICD-10-CM | POA: Diagnosis not present

## 2022-08-25 DIAGNOSIS — E782 Mixed hyperlipidemia: Secondary | ICD-10-CM | POA: Insufficient documentation

## 2022-08-25 DIAGNOSIS — I350 Nonrheumatic aortic (valve) stenosis: Secondary | ICD-10-CM | POA: Diagnosis present

## 2022-08-25 DIAGNOSIS — K648 Other hemorrhoids: Secondary | ICD-10-CM | POA: Diagnosis present

## 2022-08-25 LAB — CBC WITH DIFFERENTIAL/PLATELET
Abs Immature Granulocytes: 0.02 10*3/uL (ref 0.00–0.07)
Basophils Absolute: 0 10*3/uL (ref 0.0–0.1)
Basophils Relative: 1 %
Eosinophils Absolute: 0.2 10*3/uL (ref 0.0–0.5)
Eosinophils Relative: 3 %
HCT: 35.1 % — ABNORMAL LOW (ref 36.0–46.0)
Hemoglobin: 10.9 g/dL — ABNORMAL LOW (ref 12.0–15.0)
Immature Granulocytes: 0 %
Lymphocytes Relative: 35 %
Lymphs Abs: 2 10*3/uL (ref 0.7–4.0)
MCH: 27.6 pg (ref 26.0–34.0)
MCHC: 31.1 g/dL (ref 30.0–36.0)
MCV: 88.9 fL (ref 80.0–100.0)
Monocytes Absolute: 0.5 10*3/uL (ref 0.1–1.0)
Monocytes Relative: 8 %
Neutro Abs: 3 10*3/uL (ref 1.7–7.7)
Neutrophils Relative %: 53 %
Platelets: 189 10*3/uL (ref 150–400)
RBC: 3.95 MIL/uL (ref 3.87–5.11)
RDW: 13.7 % (ref 11.5–15.5)
WBC: 5.7 10*3/uL (ref 4.0–10.5)
nRBC: 0 % (ref 0.0–0.2)

## 2022-08-25 LAB — BASIC METABOLIC PANEL
Anion gap: 8 (ref 5–15)
BUN: 23 mg/dL (ref 8–23)
CO2: 30 mmol/L (ref 22–32)
Calcium: 8.8 mg/dL — ABNORMAL LOW (ref 8.9–10.3)
Chloride: 102 mmol/L (ref 98–111)
Creatinine, Ser: 1.13 mg/dL — ABNORMAL HIGH (ref 0.44–1.00)
GFR, Estimated: 53 mL/min — ABNORMAL LOW (ref >60)
Glucose, Bld: 89 mg/dL (ref 70–99)
Potassium: 3.7 mmol/L (ref 3.5–5.1)
Sodium: 140 mmol/L (ref 135–145)

## 2022-08-25 LAB — HEMOGLOBIN AND HEMATOCRIT, BLOOD
HCT: 32.4 % — ABNORMAL LOW (ref 36.0–46.0)
Hemoglobin: 10.2 g/dL — ABNORMAL LOW (ref 12.0–15.0)

## 2022-08-25 LAB — TYPE AND SCREEN
ABO/RH(D): B POS
Antibody Screen: NEGATIVE

## 2022-08-25 LAB — POC OCCULT BLOOD, ED: Occult Blood: POSITIVE

## 2022-08-25 MED ORDER — ONDANSETRON HCL 4 MG/2ML IJ SOLN
4.0000 mg | Freq: Four times a day (QID) | INTRAMUSCULAR | Status: DC | PRN
  Administered 2022-08-26: 4 mg via INTRAVENOUS
  Filled 2022-08-25: qty 2

## 2022-08-25 MED ORDER — ONDANSETRON HCL 4 MG PO TABS: 4.0000 mg | ORAL_TABLET | Freq: Four times a day (QID) | ORAL | Status: DC | PRN

## 2022-08-25 MED ORDER — ACETAMINOPHEN 650 MG RE SUPP: 650.0000 mg | Freq: Four times a day (QID) | RECTAL | Status: DC | PRN

## 2022-08-25 MED ORDER — ACETAMINOPHEN 325 MG PO TABS
650.0000 mg | ORAL_TABLET | Freq: Four times a day (QID) | ORAL | Status: DC | PRN
  Administered 2022-08-26: 650 mg via ORAL
  Filled 2022-08-25: qty 2

## 2022-08-25 NOTE — ED Provider Notes (Signed)
[place] EMERGENCY DEPARTMENT AT [facility] Provider Note  CSN: [id number] Arrival date & time: 08/25/22 1713  Chief Complaint(s) GI Bleeding  HPI Stacey Mcneil is a 72 y.o. female with history of CKD, diabetes, hypertension presenting to the emergency department for rectal bleeding.  Patient reports developing acute onset of rectal bleeding today.  She denies any abdominal pain and reports otherwise she actually feels very well.  No lightheadedness or dizziness, fainting, chest pain, back pain, fevers or chills, or any other symptoms.  Denies any blood thinners.  She reports multiple episodes of rectal bleeding.  Reports bright red blood.   Past Medical History Past Medical History:  Diagnosis Date   Anemia    Chronic kidney disease    Diabetes mellitus without complication (HCC)    Hypertension    Patient Active Problem List   Diagnosis Date Noted   Heart murmur 08/09/2021   Lower abdominal pain 10/12/2019   GERD (gastroesophageal reflux disease) 03/18/2018   Morbid obesity (HCC) 04/15/2017   Dysphagia    Dyspepsia 01/14/2017   Home Medication(s) Prior to Admission medications   Medication Sig Start Date End Date Taking? Authorizing Provider  acetaminophen (TYLENOL) 650 MG CR tablet Take 650 mg by mouth every 8 (eight) hours as needed for pain.    [provider]  atorvastatin (LIPITOR) 10 MG tablet Take 10 mg by mouth daily.    [provider]  azelastine (ASTELIN) 0.1 % nasal spray Place 1 spray into both nostrils as needed. 11/27/21   [provider]  cholecalciferol (VITAMIN D3) 25 MCG (1000 UNIT) tablet Take 1,000 Units by mouth daily. 09/16/21   [provider]  Cyanocobalamin (B-12) 500 MCG TABS Take 500 mcg by mouth daily.    [provider]  hydrochlorothiazide (HYDRODIURIL) 25 MG tablet Take 25 mg by mouth daily. 12/15/16   [provider]  lisinopril (PRINIVIL,ZESTRIL) 10 MG tablet Take 10 mg by mouth  daily.  12/15/16   [provider]  metFORMIN (GLUCOPHAGE) 500 MG tablet Take 500 mg by mouth daily.    [provider]  metoprolol succinate (TOPROL-XL) 50 MG 24 hr tablet Take 50 mg by mouth daily. 12/15/16   [provider]  Multiple Vitamins-Minerals (MULTIVITAMIN WITH MINERALS) tablet Take 1 tablet by mouth daily.    [provider]  Omega-3 Fatty Acids (FISH OIL) 1200 MG CAPS Take 1,200 mg by mouth daily.     [provider]  omeprazole (PRILOSEC) 40 MG capsule Take 40 mg by mouth daily. 12/15/16   [provider]                                                                                                                                    Past Surgical History Past Surgical History:  Procedure Laterality Date   BALLOON DILATION N/A 11/12/2020   Procedure: BALLOON DILATION;  Surgeon: Lanelle Bal, DO;  [facility];  Service: Endoscopy;  Laterality: N/A;   BIOPSY  11/12/2020   Procedure: BIOPSY;  Surgeon: Lanelle Bal, DO;  [facility];  Service: Endoscopy;;   COLONOSCOPY  01/2016   [facility] healthcare: Perianal skin tag found in the perianal area, diverticulosis, nonbleeding internal hemorrhoids.  Quality of prep was good.  Next colonoscopy 10 years   ESOPHAGOGASTRODUODENOSCOPY N/A 01/20/2017   Fields: Mild Schatzki ring status post dilation, gastritis (benign pathology with no H. 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)   ESOPHAGOGASTRODUODENOSCOPY (EGD) WITH PROPOFOL N/A 11/12/2020   Procedure: ESOPHAGOGASTRODUODENOSCOPY (EGD) WITH PROPOFOL;  Surgeon: Lanelle Bal, DO;  [facility];  Service: Endoscopy;  Laterality: N/A;  9:30am   SAVORY DILATION N/A 01/20/2017   Procedure: SAVORY DILATION;  Surgeon: West Bali, MD;  Location: [facility];  Service: Endoscopy;  Laterality: N/A;   Family History Family History  Problem Relation Age of Onset   Congestive  Heart Failure Father    Stroke Mother    Leukemia Brother    Heart attack Sister    Hypertension Sister    Diabetes Sister    Other Brother        double pneumonia; paralyzed   Kidney disease Brother    Diabetes Brother    Hypertension Brother    Other Brother        on dialysis   Hypertension Sister    Diabetes Sister    Hypertension Sister    Diabetes Sister    Stroke Sister    Hypertension Daughter    Hypertension Daughter    Colon cancer Neg Hx    Colon polyps Neg Hx     Social History Social History   Tobacco Use   Smoking status: Never    Passive exposure: Never   Smokeless tobacco: Never  Vaping Use   Vaping Use: Never used  Substance Use Topics   Alcohol use: No   Drug use: No   Allergies Patient has no known allergies.  Review of Systems Review of Systems  All other systems reviewed and are negative.   Physical Exam Vital Signs  I have reviewed the triage vital signs BP (!) 149/72 (BP Location: Left Arm)   Pulse 72   Temp 97.9 F (36.6 C) (Oral)   Resp 16   Ht 5' 1.5" (1.562 m)   Wt 112.5 kg   SpO2 96%   BMI 46.10 kg/m  Physical Exam Vitals and nursing note reviewed.  Constitutional:      General: She is not in acute distress.    Appearance: She is well-developed.  HENT:     Head: Normocephalic and atraumatic.     Mouth/Throat:     Mouth: Mucous membranes are moist.  Eyes:     Pupils: Pupils are equal, round, and reactive to light.  Cardiovascular:     Rate and Rhythm: Normal rate and regular rhythm.     Heart sounds: No murmur heard. Pulmonary:     Effort: Pulmonary effort is normal. No respiratory distress.     Breath sounds: Normal breath sounds.  Abdominal:     General: Abdomen is flat.     Palpations: Abdomen is soft.     Tenderness: There is no abdominal tenderness.  Musculoskeletal:        General: No tenderness.     Right lower leg: No edema.     Left lower leg: No edema.  Skin:  General: Skin is warm and dry.   Neurological:     General: No focal deficit present.     Mental Status: She is alert. Mental status is at baseline.  Psychiatric:        Mood and Affect: Mood normal.        Behavior: Behavior normal.     ED Results and Treatments Labs (all labs ordered are listed, but only abnormal results are displayed) Labs Reviewed  CBC WITH DIFFERENTIAL/PLATELET - Abnormal; Notable for the following components:      Result Value   Hemoglobin 10.9 (*)    HCT 35.1 (*)    All other components within normal limits  BASIC METABOLIC PANEL - Abnormal; Notable for the following components:   Creatinine, Ser 1.13 (*)    Calcium 8.8 (*)    GFR, Estimated 53 (*)    All other components within normal limits  HEMOGLOBIN AND HEMATOCRIT, BLOOD  POC OCCULT BLOOD, ED  TYPE AND SCREEN                                                                                                                          Radiology No results found.  Pertinent labs & imaging results that were available during my care of the patient were reviewed by me and considered in my medical decision making (see MDM for details).  Medications Ordered in ED Medications - No data to display                                                                                                                                   Procedures Procedures  (including critical care time)  Medical Decision Making / ED Course   MDM:  73 year old female presenting to the emergency department rectal bleeding.  Patient overall well-appearing, physical exam reassuring with no abdominal tenderness.  Stool at bedside is grossly positive for blood.  Will recheck CBC as initial hemoglobin 10.9 which is slightly low.  She has no abdominal pain to suggest need for CT and to evaluate for acute infectious process such as colitis or diverticulitis.  Does have diverticulosis.  If repeat hemoglobin drops may need admission for further monitoring and  evaluation.      Additional history obtained: -Additional history obtained from {wsadditionalhistorian:28072} -External records from outside source obtained and reviewed including: Chart review including previous notes, labs, imaging, consultation notes including ***  Lab Tests: -I ordered, reviewed, and interpreted labs.   The pertinent results include:   Labs Reviewed  CBC WITH DIFFERENTIAL/PLATELET - Abnormal; Notable for the following components:      Result Value   Hemoglobin 10.9 (*)    HCT 35.1 (*)    All other components within normal limits  BASIC METABOLIC PANEL - Abnormal; Notable for the following components:   Creatinine, Ser 1.13 (*)    Calcium 8.8 (*)    GFR, Estimated 53 (*)    All other components within normal limits  HEMOGLOBIN AND HEMATOCRIT, BLOOD  POC OCCULT BLOOD, ED  TYPE AND SCREEN    Notable for ***  EKG   EKG Interpretation  Date/Time:    Ventricular Rate:    PR Interval:    QRS Duration:   QT Interval:    QTC Calculation:   R Axis:     Text Interpretation:           Imaging Studies ordered: I ordered imaging studies including *** On my interpretation imaging demonstrates *** I independently visualized and interpreted imaging. I agree with the radiologist interpretation   Medicines ordered and prescription drug management: No orders of the defined types were placed in this encounter.   -I have reviewed the patients home medicines and have made adjustments as needed   Consultations Obtained: I requested consultation with the ***,  and discussed lab and imaging findings as well as pertinent plan - they recommend: ***   Cardiac Monitoring: The patient was maintained on a cardiac monitor.  I personally viewed and interpreted the cardiac monitored which showed an underlying rhythm of: ***  Social Determinants of Health:  Diagnosis or treatment significantly limited by social determinants of health:  {wssoc:28071}   Reevaluation: After the interventions noted above, I reevaluated the patient and found that their symptoms have {resolved/improved/worsened:23923::"improved"}  Co morbidities that complicate the patient evaluation  Past Medical History:  Diagnosis Date   Anemia    Chronic kidney disease    Diabetes mellitus without complication (HCC)    Hypertension       Dispostion: Disposition decision including need for hospitalization was considered, and patient {wsdispo:28070::"discharged from emergency department."}    Final Clinical Impression(s) / ED Diagnoses Final diagnoses:  None     This chart was dictated using voice recognition software.  Despite best efforts to proofread,  errors can occur which can change the documentation meaning.

## 2022-08-25 NOTE — ED Provider Triage Note (Signed)
Emergency Medicine Provider Triage Evaluation Note  Stacey Mcneil , a 72 y.o. female  was evaluated in triage.  Pt complains of gi bleed which started today.  She has intermittent abdominal cramping which is mild, followed by bright red blood per rectum.  She has also passed several blood clots.  She denies weakness or dizziness.  Denies prior similar history.  She does have known diverticulosis.  She is under the care of Dr. Marletta Lor.  Review of Systems  Positive: Bright red blood per rectum.  Abdominal cramping. Negative: Nausea, vomiting, dizziness  Physical Exam  BP (!) 187/72 (BP Location: Right Arm)   Pulse 71   Temp 98.5 F (36.9 C) (Oral)   Resp 16   Ht 5' 1.5" (1.562 m)   Wt 112.5 kg   SpO2 100%   BMI 46.10 kg/m  Gen:   Awake, no distress   Resp:  Normal effort  MSK:   Moves extremities without difficulty  Other:    Medical Decision Making  Medically screening exam initiated at 7:22 PM.  Appropriate orders placed.  YANIRA SWARTWOUT was informed that the remainder of the evaluation will be completed by another provider, this initial triage assessment does not replace that evaluation, and the importance of remaining in the ED until their evaluation is complete.     Burgess Amor, PA-C 08/25/22 1933

## 2022-08-25 NOTE — H&P (Signed)
History and Physical    Patient: Stacey Mcneil:[id number] DOB: 03/31/1951 DOA: 08/25/2022 DOS: the patient was seen and examined on 08/26/2022 PCP: [facility]  Patient coming from: Home  Chief Complaint:  Chief Complaint  Patient presents with   GI Bleeding   HPI: Stacey Mcneil is a 72 y.o. female with medical history significant of hypertension, hyperlipidemia, type 2 diabetes mellitus, GERD, CKD 3A who presents to the emergency department due to rectal bleed.  Patient states that she noted blood after using the toilet this morning, and she has had 3 more episodes of bright red blood per rectum.  She decided to go to the ED for further evaluation and management, however, she had 2 more episodes of blood in the commode PTA. She denies abdominal pain, nausea, vomiting, chest pain, shortness of breath, fever, chills  ED Course:  In the emergency department, BP was 156/66, other vital signs were within normal range.  Workup in the ED was normal except for hemoglobin of 10.9 and hematocrit of 35.1.  BMP was normal except for creatinine of 1.13 with EGFR of 53. Gastroenterologist (Dr. Marletta Lor) was consulted and will follow-up with patient with plan for colonoscopy on Wednesday (5/22) per EDP. Hospitalist was asked to admit patient for further evaluation and management.  Review of Systems: Review of systems as noted in the HPI. All other systems reviewed and are negative.   Past Medical History:  Diagnosis Date   Anemia    Chronic kidney disease    Diabetes mellitus without complication (HCC)    Hypertension    Past Surgical History:  Procedure Laterality Date   BALLOON DILATION N/A 11/12/2020   Procedure: BALLOON DILATION;  Surgeon: Lanelle Bal, DO;  [facility];  Service: Endoscopy;  Laterality: N/A;   BIOPSY  11/12/2020   Procedure: BIOPSY;  Surgeon: Lanelle Bal, DO;  [facility];  Service: Endoscopy;;   COLONOSCOPY   01/2016   [facility] healthcare: Perianal skin tag found in the perianal area, diverticulosis, nonbleeding internal hemorrhoids.  Quality of prep was good.  Next colonoscopy 10 years   ESOPHAGOGASTRODUODENOSCOPY N/A 01/20/2017   Fields: Mild Schatzki ring status post dilation, gastritis (benign pathology with no H. 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)   ESOPHAGOGASTRODUODENOSCOPY (EGD) WITH PROPOFOL N/A 11/12/2020   Procedure: ESOPHAGOGASTRODUODENOSCOPY (EGD) WITH PROPOFOL;  Surgeon: Lanelle Bal, DO;  [facility];  Service: Endoscopy;  Laterality: N/A;  9:30am   SAVORY DILATION N/A 01/20/2017   Procedure: SAVORY DILATION;  Surgeon: West Bali, MD;  [facility];  Service: Endoscopy;  Laterality: N/A;    Social History:  reports that she has never smoked. She has never been exposed to tobacco smoke. She has never used smokeless tobacco. She reports that she does not drink alcohol and does not use drugs.   No Known Allergies  Family History  Problem Relation Age of Onset   Congestive Heart Failure Father    Stroke Mother    Leukemia Brother    Heart attack Sister    Hypertension Sister    Diabetes Sister    Other Brother        double pneumonia; paralyzed   Kidney disease Brother    Diabetes Brother    Hypertension Brother    Other Brother        on dialysis   Hypertension Sister    Diabetes Sister  Hypertension Sister    Diabetes Sister    Stroke Sister    Hypertension Daughter    Hypertension Daughter    Colon cancer Neg Hx    Colon polyps Neg Hx      Prior to Admission medications   Medication Sig Start Date End Date Taking? Authorizing Provider  acetaminophen (TYLENOL) 650 MG CR tablet Take 650 mg by mouth every 8 (eight) hours as needed for pain.    [provider]  atorvastatin (LIPITOR) 10 MG tablet Take 10 mg by mouth daily.    [provider]  azelastine (ASTELIN) 0.1 % nasal spray Place  1 spray into both nostrils as needed. 11/27/21   [provider]  cholecalciferol (VITAMIN D3) 25 MCG (1000 UNIT) tablet Take 1,000 Units by mouth daily. 09/16/21   [provider]  Cyanocobalamin (B-12) 500 MCG TABS Take 500 mcg by mouth daily.    [provider]  hydrochlorothiazide (HYDRODIURIL) 25 MG tablet Take 25 mg by mouth daily. 12/15/16   [provider]  lisinopril (PRINIVIL,ZESTRIL) 10 MG tablet Take 10 mg by mouth daily.  12/15/16   [provider]  metFORMIN (GLUCOPHAGE) 500 MG tablet Take 500 mg by mouth daily.    [provider]  metoprolol succinate (TOPROL-XL) 50 MG 24 hr tablet Take 50 mg by mouth daily. 12/15/16   [provider]  Multiple Vitamins-Minerals (MULTIVITAMIN WITH MINERALS) tablet Take 1 tablet by mouth daily.    [provider]  Omega-3 Fatty Acids (FISH OIL) 1200 MG CAPS Take 1,200 mg by mouth daily.     [provider]  omeprazole (PRILOSEC) 40 MG capsule Take 40 mg by mouth daily. 12/15/16   [provider]    Physical Exam: BP 139/66 (BP Location: Right Arm)   Pulse 71   Temp 97.7 F (36.5 C) (Oral)   Resp 16   Ht 5' 1.5" (1.562 m)   Wt 112.5 kg   SpO2 100%   BMI 46.10 kg/m   General: 72 y.o. year-old female well developed well nourished in no acute distress.  Alert and oriented x3. HEENT: NCAT, EOMI Neck: Supple, trachea medial Cardiovascular: Regular rate and rhythm with no rubs or gallops.  No thyromegaly or JVD noted.  Trace edema in lower extremity bilaterally.  2/4 pulses in all 4 extremities. Respiratory: Clear to auscultation with no wheezes or rales. Good inspiratory effort. Abdomen: Soft, nontender nondistended with normal bowel sounds x4 quadrants. Muskuloskeletal: No cyanosis, clubbing or edema noted bilaterally Neuro: CN II-XII intact, strength 5/5 x 4, sensation, reflexes intact Skin: No ulcerative lesions noted or rashes Psychiatry: Judgement and  insight appear normal. Mood is appropriate for condition and setting          Labs on Admission:  Basic Metabolic Panel: Recent Labs  Lab 08/25/22 1821  NA 140  K 3.7  CL 102  CO2 30  GLUCOSE 89  BUN 23  CREATININE 1.13*  CALCIUM 8.8*   Liver Function Tests: No results for input(s): "AST", "ALT", "ALKPHOS", "BILITOT", "PROT", "ALBUMIN" in the last 168 hours. No results for input(s): "LIPASE", "AMYLASE" in the last 168 hours. No results for input(s): "AMMONIA" in the last 168 hours. CBC: Recent Labs  Lab 08/25/22 1821 08/25/22 2130 08/26/22 0444  WBC 5.7  --  4.4  NEUTROABS 3.0  --   --   HGB 10.9* 10.2* 10.0*  HCT 35.1* 32.4* 31.5*  MCV 88.9  --  88.2  PLT 189  --  175   Cardiac Enzymes: No results for input(s): "CKTOTAL", "CKMB", "CKMBINDEX", "TROPONINI" in the last 168 hours.  BNP (last 3 results) No results for input(s): "BNP" in the last 8760 hours.  ProBNP (last 3 results) No results for input(s): "PROBNP" in the last 8760 hours.  CBG: No results for input(s): "GLUCAP" in the last 168 hours.  Radiological Exams on Admission: No results found.  EKG: I independently viewed the EKG done and my findings are as followed: EKG was not done in the ED  Assessment/Plan Present on Admission:  Rectal bleeding  GERD (gastroesophageal reflux disease)  Morbid obesity (HCC)  Principal Problem:   Rectal bleeding Active Problems:   Morbid obesity (HCC)   GERD (gastroesophageal reflux disease)   Essential hypertension   Mixed hyperlipidemia   Type 2 diabetes mellitus with chronic kidney disease, without long-term current use of insulin (HCC)  Rectal bleeding H/H= 10.9/35.1, this was 12.1/38.7 about 1 year ago Type and crossmatch was done Continue IV Protonix 40 mg twice daily Gastroenterologist  was consulted and will follow-up with patient in the morning per EDP  Essential hypertension Continue lisinopril, Toprol-XL  Mixed hyperlipidemia Continue  Lipitor  Type 2 diabetes mellitus Continue ISS and hypoglycemia protocol Metformin will be held at this time  GERD Continue Protonix  CKD 3A Creatinine 1.13 (creatinine is within baseline range) eGFR 53 Renally adjust medications, avoid nephrotoxic agents/dehydration/hypotension  Morbid obesity (BMI 46.10) Diet and lifestyle modification   DVT prophylaxis: SCDs  Advance Care Planning: Full code  Consults: Gastroenterology  Family Communication: None at bedside  Severity of Illness: The appropriate patient status for this patient is INPATIENT. Inpatient status is judged to be reasonable and necessary in order to provide the required intensity of service to ensure the patient's safety. The patient's presenting symptoms, physical exam findings, and initial radiographic and laboratory data in the context of their chronic comorbidities is felt to place them at high risk for further clinical deterioration. Furthermore, it is not anticipated that the patient will be medically stable for discharge from the hospital within 2 midnights of admission.   * I certify that at the point of admission it is my clinical judgment that the patient will require inpatient hospital care spanning beyond 2 midnights from the point of admission due to high intensity of service, high risk for further deterioration and high frequency of surveillance required.*  Author: Frankey Shown, DO 08/26/2022 5:51 AM  For on call review [email or web address].

## 2022-08-25 NOTE — ED Triage Notes (Signed)
Pt with bright red blood from rectum starting today. Denies any hx of hemorrhoids.

## 2022-08-26 DIAGNOSIS — I10 Essential (primary) hypertension: Secondary | ICD-10-CM | POA: Insufficient documentation

## 2022-08-26 DIAGNOSIS — E1122 Type 2 diabetes mellitus with diabetic chronic kidney disease: Secondary | ICD-10-CM | POA: Insufficient documentation

## 2022-08-26 DIAGNOSIS — E782 Mixed hyperlipidemia: Secondary | ICD-10-CM | POA: Insufficient documentation

## 2022-08-26 DIAGNOSIS — K625 Hemorrhage of anus and rectum: Secondary | ICD-10-CM | POA: Diagnosis not present

## 2022-08-26 LAB — GLUCOSE, CAPILLARY
Glucose-Capillary: 62 mg/dL — ABNORMAL LOW (ref 70–99)
Glucose-Capillary: 130 mg/dL — ABNORMAL HIGH (ref 70–99)
Glucose-Capillary: 87 mg/dL (ref 70–99)

## 2022-08-26 LAB — COMPREHENSIVE METABOLIC PANEL
ALT: 15 U/L (ref 0–44)
AST: 17 U/L (ref 15–41)
Albumin: 3.1 g/dL — ABNORMAL LOW (ref 3.5–5.0)
Alkaline Phosphatase: 65 U/L (ref 38–126)
Anion gap: 7 (ref 5–15)
BUN: 19 mg/dL (ref 8–23)
CO2: 27 mmol/L (ref 22–32)
Calcium: 8.4 mg/dL — ABNORMAL LOW (ref 8.9–10.3)
Chloride: 105 mmol/L (ref 98–111)
Creatinine, Ser: 0.91 mg/dL (ref 0.44–1.00)
GFR, Estimated: >60 mL/min (ref >60)
Glucose, Bld: 102 mg/dL — ABNORMAL HIGH (ref 70–99)
Potassium: 3.4 mmol/L — ABNORMAL LOW (ref 3.5–5.1)
Sodium: 139 mmol/L (ref 135–145)
Total Bilirubin: 0.6 mg/dL (ref 0.3–1.2)
Total Protein: 6.5 g/dL (ref 6.5–8.1)

## 2022-08-26 LAB — CBC
HCT: 31.5 % — ABNORMAL LOW (ref 36.0–46.0)
Hemoglobin: 10 g/dL — ABNORMAL LOW (ref 12.0–15.0)
MCH: 28 pg (ref 26.0–34.0)
MCHC: 31.7 g/dL (ref 30.0–36.0)
MCV: 88.2 fL (ref 80.0–100.0)
Platelets: 175 10*3/uL (ref 150–400)
RBC: 3.57 MIL/uL — ABNORMAL LOW (ref 3.87–5.11)
RDW: 13.7 % (ref 11.5–15.5)
WBC: 4.4 10*3/uL (ref 4.0–10.5)
nRBC: 0 % (ref 0.0–0.2)

## 2022-08-26 LAB — PHOSPHORUS: Phosphorus: 3.7 mg/dL (ref 2.5–4.6)

## 2022-08-26 LAB — HIV ANTIBODY (ROUTINE TESTING W REFLEX): HIV Screen 4th Generation wRfx: NONREACTIVE

## 2022-08-26 LAB — MAGNESIUM: Magnesium: 1.9 mg/dL (ref 1.7–2.4)

## 2022-08-26 LAB — HEMOGLOBIN A1C
Hgb A1c MFr Bld: 5.9 % — ABNORMAL HIGH (ref 4.8–5.6)
Mean Plasma Glucose: 122.63 mg/dL

## 2022-08-26 MED ORDER — VITAMIN B-12 1000 MCG PO TABS
500.0000 ug | ORAL_TABLET | Freq: Every day | ORAL | Status: DC
  Administered 2022-08-26 – 2022-08-29 (×4): 500 ug via ORAL
  Filled 2022-08-26 (×4): qty 1

## 2022-08-26 MED ORDER — METOPROLOL SUCCINATE ER 50 MG PO TB24
50.0000 mg | ORAL_TABLET | Freq: Every day | ORAL | Status: DC
  Administered 2022-08-26 – 2022-08-29 (×4): 50 mg via ORAL
  Filled 2022-08-26 (×4): qty 1

## 2022-08-26 MED ORDER — INSULIN ASPART 100 UNIT/ML IJ SOLN
0.0000 [IU] | Freq: Three times a day (TID) | INTRAMUSCULAR | Status: DC
  Administered 2022-08-29: 1 [IU] via SUBCUTANEOUS

## 2022-08-26 MED ORDER — VITAMIN D 25 MCG (1000 UNIT) PO TABS
1000.0000 [IU] | ORAL_TABLET | Freq: Every day | ORAL | Status: DC
  Administered 2022-08-26 – 2022-08-29 (×4): 1000 [IU] via ORAL
  Filled 2022-08-26 (×4): qty 1

## 2022-08-26 MED ORDER — POTASSIUM CHLORIDE CRYS ER 20 MEQ PO TBCR
40.0000 meq | EXTENDED_RELEASE_TABLET | Freq: Two times a day (BID) | ORAL | Status: AC
  Administered 2022-08-26 (×2): 40 meq via ORAL
  Filled 2022-08-26 (×2): qty 2

## 2022-08-26 MED ORDER — PANTOPRAZOLE SODIUM 40 MG IV SOLR
40.0000 mg | Freq: Two times a day (BID) | INTRAVENOUS | Status: DC
  Administered 2022-08-26 – 2022-08-29 (×6): 40 mg via INTRAVENOUS
  Filled 2022-08-26 (×7): qty 10

## 2022-08-26 MED ORDER — LISINOPRIL 10 MG PO TABS
10.0000 mg | ORAL_TABLET | Freq: Every day | ORAL | Status: DC
  Administered 2022-08-26 – 2022-08-29 (×4): 10 mg via ORAL
  Filled 2022-08-26 (×4): qty 1

## 2022-08-26 MED ORDER — ATORVASTATIN CALCIUM 10 MG PO TABS
10.0000 mg | ORAL_TABLET | Freq: Every day | ORAL | Status: DC
  Administered 2022-08-26 – 2022-08-29 (×4): 10 mg via ORAL
  Filled 2022-08-26 (×4): qty 1

## 2022-08-26 MED ORDER — PEG 3350-KCL-NA BICARB-NACL 420 G PO SOLR
4000.0000 mL | Freq: Once | ORAL | Status: AC
  Administered 2022-08-26: 4000 mL via ORAL

## 2022-08-26 NOTE — Progress Notes (Signed)
PROGRESS NOTE    Stacey Mcneil  ZOX:[id number] DOB: [birth year] DOA: 08/25/2022 PCP: [facility]   Brief Narrative:   Stacey Mcneil is a 72 y.o. female with medical history significant of hypertension, hyperlipidemia, type 2 diabetes mellitus, GERD, CKD 3A who presents to the emergency department due to rectal bleed.  Her hemoglobin levels have remained stable and she has been assessed by GI today with plans for colonoscopy in AM.  Assessment & Plan:   Principal Problem:   Rectal bleeding Active Problems:   Morbid obesity (HCC)   GERD (gastroesophageal reflux disease)   Essential hypertension   Mixed hyperlipidemia   Type 2 diabetes mellitus with chronic kidney disease, without long-term current use of insulin (HCC)  Assessment and Plan:   Rectal bleeding Hemoglobin remained stable Continue Protonix GI for colonoscopy in a.m. N.p.o. after midnight and currently on clear liquid diet  Mild hypokalemia Replete and reevaluate in a.m.   Essential hypertension Continue lisinopril, Toprol-XL   Mixed hyperlipidemia Continue Lipitor   Type 2 diabetes mellitus Continue ISS and hypoglycemia protocol Metformin will be held at this time   GERD Continue Protonix   CKD 3A Creatinine 1.13 (creatinine is within baseline range) eGFR 53 Renally adjust medications, avoid nephrotoxic agents/dehydration/hypotension   Morbid obesity (BMI 46.10) Diet and lifestyle modification    DVT prophylaxis:SCDs Code Status: Full Family Communication: None at bedside Disposition Plan:  Status is: Inpatient Remains inpatient appropriate because: Need for inpatient procedure and IV medications.   Consultants:  GI  Procedures:  None  Antimicrobials:  None   Subjective: Patient seen and evaluated today with no new acute complaints or concerns. No acute concerns or events noted overnight.  She states that she has been passing some clots with bowel  movement this morning, but denies any active bleeding.  She is otherwise feeling well and denies any abdominal pain.  Objective: Vitals:   08/26/22 0244 08/26/22 0542 08/26/22 0936 08/26/22 0942  BP: 137/60 139/66 (!) 161/81 (!) 161/81  Pulse: 69 71 69 69  Resp: 18 16  18   Temp: 97.9 F (36.6 C) 97.7 F (36.5 C)  98.3 F (36.8 C)  TempSrc:  Oral    SpO2: 100% 100%  100%  Weight:      Height:       No intake or output data in the 24 hours ending 08/26/22 1118 Filed Weights   08/25/22 1751  Weight: 112.5 kg    Examination:  General exam: Appears calm and comfortable, obese Respiratory system: Clear to auscultation. Respiratory effort normal. Cardiovascular system: S1 & S2 heard, RRR.  Gastrointestinal system: Abdomen is soft Central nervous system: Alert and awake Extremities: No edema Skin: No significant lesions noted Psychiatry: Flat affect.    Data Reviewed: I have personally reviewed following labs and imaging studies  CBC: Recent Labs  Lab 08/25/22 1821 08/25/22 2130 08/26/22 0444  WBC 5.7  --  4.4  NEUTROABS 3.0  --   --   HGB 10.9* 10.2* 10.0*  HCT 35.1* 32.4* 31.5*  MCV 88.9  --  88.2  PLT 189  --  175   Basic Metabolic Panel: Recent Labs  Lab 08/25/22 1821 08/26/22 0444  NA 140 139  K 3.7 3.4*  CL 102 105  CO2 30 27  GLUCOSE 89 102*  BUN 23 19  CREATININE 1.13* 0.91  CALCIUM 8.8* 8.4*  MG  --  1.9  PHOS  --  3.7   GFR: Estimated  Creatinine Clearance: 68.5 mL/min (by C-G formula based on SCr of 0.91 mg/dL). Liver Function Tests: Recent Labs  Lab 08/26/22 0444  AST 17  ALT 15  ALKPHOS 65  BILITOT 0.6  PROT 6.5  ALBUMIN 3.1*   No results for input(s): "LIPASE", "AMYLASE" in the last 168 hours. No results for input(s): "AMMONIA" in the last 168 hours. Coagulation Profile: No results for input(s): "INR", "PROTIME" in the last 168 hours. Cardiac Enzymes: No results for input(s): "CKTOTAL", "CKMB", "CKMBINDEX", "TROPONINI" in the  last 168 hours. BNP (last 3 results) No results for input(s): "PROBNP" in the last 8760 hours. HbA1C: Recent Labs    08/26/22 0444  HGBA1C 5.9*   CBG: No results for input(s): "GLUCAP" in the last 168 hours. Lipid Profile: No results for input(s): "CHOL", "HDL", "LDLCALC", "TRIG", "CHOLHDL", "LDLDIRECT" in the last 72 hours. Thyroid Function Tests: No results for input(s): "TSH", "T4TOTAL", "FREET4", "T3FREE", "THYROIDAB" in the last 72 hours. Anemia Panel: No results for input(s): "VITAMINB12", "FOLATE", "FERRITIN", "TIBC", "IRON", "RETICCTPCT" in the last 72 hours. Sepsis Labs: No results for input(s): "PROCALCITON", "LATICACIDVEN" in the last 168 hours.  No results found for this or any previous visit (from the past 240 hour(s)).       Radiology Studies: No results found.      Scheduled Meds:  atorvastatin  10 mg Oral Daily   cholecalciferol  1,000 Units Oral Daily   cyanocobalamin  500 mcg Oral Daily   insulin aspart  0-9 Units Subcutaneous TID WC   lisinopril  10 mg Oral Daily   metoprolol succinate  50 mg Oral Daily   pantoprazole (PROTONIX) IV  40 mg Intravenous Q12H   potassium chloride  40 mEq Oral BID     LOS: 1 day    Time spent: 35 minutes    Marylynn Rigdon Hoover Brunette, DO [facility]  If 7PM-7AM, please contact night-coverage [email or web address] 08/26/2022, 11:18 AM

## 2022-08-26 NOTE — ED Notes (Signed)
Unable to give report d/t charge nurse and primary nurse being on break/

## 2022-08-26 NOTE — TOC Progression Note (Signed)
Transition of Care [facility]) - Progression Note    Patient Details  Name: TAIZ HORTON MRN: [id number] Date of Birth: 1950/06/22  Transition of Care [facility]) CM/SW Contact  Karn Cassis, [place] Phone Number: 08/26/2022, 10:27 AM  Clinical Narrative:   Transition of Care [facility]) Screening Note   Patient Details  Name: ANGELIAH TABOR Date of Birth: 09/02/1950   Transition of Care [facility]) CM/SW Contact:    Karn Cassis, LCSW Phone Number: 08/26/2022, 10:27 AM    Transition of Care Department [facility]) has reviewed patient and no TOC needs have been identified at this time. We will continue to monitor patient advancement through interdisciplinary progression rounds. If new patient transition needs arise, please place a TOC consult.            Expected Discharge Plan and Services                                               Social Determinants of Health (SDOH) Interventions SDOH Screenings   Food Insecurity: No Food Insecurity (06/15/2020)  Housing: Low Risk  (08/26/2022)  Transportation Needs: No Transportation Needs (06/15/2020)  Alcohol Screen: Low Risk  (06/15/2020)  Depression (PHQ2-9): Low Risk  (06/15/2020)  Financial Resource Strain: Low Risk  (06/15/2020)  Physical Activity: Insufficiently Active (06/15/2020)  Social Connections: Moderately Integrated (06/15/2020)  Stress: No Stress Concern Present (06/15/2020)  Tobacco Use: Low Risk  (08/25/2022)    Readmission Risk Interventions     No data to display

## 2022-08-26 NOTE — H&P (View-Only) (Signed)
@LOGO @   Referring Provider: [facility] Primary Care Physician:  [facility] Primary Gastroenterologist:  Dr. Marletta Lor  Date of Admission: 08/25/2022 Date of Consultation: 08/26/2022  Reason for Consultation: GI bleed  HPI:  Stacey Mcneil is a 72 y.o. year old female with medical history significant for HTN, HLD, moderate aortic valve stenosis, type 2 diabetes, CKD, GERD, dysphagia, who presented to the emergency room due to rectal bleeding.  ED course: Hemodynamically stable. Hemoglobin 10.9. BUN within normal limits. Fecal occult blood positive. ED provider reached out to Dr. Marletta Lor who recommended admission with plans for colonoscopy on Wednesday. She started on IV PPI twice daily and placed on clear liquid diet.   Today: Hemoglobin 10.0.  Acute onset rectal bleeding starting yesterday morning. 4 episodes at home, 7 episodes here at the hospital yesterday. States she was passing red blood only. No stool. 1 BM this morning with solid stool and some clots of blood on the stool. No history of rectal bleeding in the past. No abdominal pain. No nausea, vomiting. Bowels were moving normally prior to this.   GERD had been well controled on omeprazole.   No NSAIDs.   Doesn't tolerate golytely well but is willing to try.   Last colonoscopy in [facility], dated October 2017, perianal skin tag found on the perianal area, diverticulosis, nonbleeding internal hemorrhoids. Quality of the prep was good.  Repeat colonoscopy in 10 years.   Last EGD August 2022 with multiple gastric polyps (hyperplastic), gastritis with slight chronic inflammation, no H. pylori, normal examined duodenum.  Past Medical History:  Diagnosis Date   Anemia    Chronic kidney disease    Diabetes mellitus without complication (HCC)    Hypertension     Past Surgical History:  Procedure Laterality Date   BALLOON DILATION N/A 11/12/2020   Procedure: BALLOON DILATION;   Surgeon: Lanelle Bal, DO;  [facility];  Service: Endoscopy;  Laterality: N/A;   BIOPSY  11/12/2020   Procedure: BIOPSY;  Surgeon: Lanelle Bal, DO;  Location: [facility];  Service: Endoscopy;;   COLONOSCOPY  01/2016   [facility] healthcare: Perianal skin tag found in the perianal area, diverticulosis, nonbleeding internal hemorrhoids.  Quality of prep was good.  Next colonoscopy 10 years   ESOPHAGOGASTRODUODENOSCOPY N/A 01/20/2017   Fields: Mild Schatzki ring status post dilation, gastritis (benign pathology with no H. 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)   ESOPHAGOGASTRODUODENOSCOPY (EGD) WITH PROPOFOL N/A 11/12/2020   Procedure: ESOPHAGOGASTRODUODENOSCOPY (EGD) WITH PROPOFOL;  Surgeon: Lanelle Bal, DO;  Location: [facility];  Service: Endoscopy;  Laterality: N/A;  9:30am   SAVORY DILATION N/A 01/20/2017   Procedure: SAVORY DILATION;  Surgeon: West Bali, MD;  Location: [facility];  Service: Endoscopy;  Laterality: N/A;    Prior to Admission medications   Medication Sig Start Date End Date Taking? Authorizing Provider  acetaminophen (TYLENOL) 650 MG CR tablet Take 650 mg by mouth every 8 (eight) hours as needed for pain.    [provider]  atorvastatin (LIPITOR) 10 MG tablet Take 10 mg by mouth daily.    [provider]  azelastine (ASTELIN) 0.1 % nasal spray Place 1 spray into both nostrils as needed. 11/27/21   [provider]  cholecalciferol (VITAMIN D3) 25 MCG (1000 UNIT) tablet Take 1,000 Units by mouth daily. 09/16/21   [provider]  Cyanocobalamin (B-12) 500 MCG TABS Take 500 mcg  by mouth daily.    [provider]  hydrochlorothiazide (HYDRODIURIL) 25 MG tablet Take 25 mg by mouth daily. 12/15/16   [provider]  lisinopril (PRINIVIL,ZESTRIL) 10 MG tablet Take 10 mg by mouth daily.  12/15/16   [provider]  metFORMIN (GLUCOPHAGE) 500 MG tablet Take 500  mg by mouth daily.    [provider]  metoprolol succinate (TOPROL-XL) 50 MG 24 hr tablet Take 50 mg by mouth daily. 12/15/16   [provider]  Multiple Vitamins-Minerals (MULTIVITAMIN WITH MINERALS) tablet Take 1 tablet by mouth daily.    [provider]  Omega-3 Fatty Acids (FISH OIL) 1200 MG CAPS Take 1,200 mg by mouth daily.     [provider]  omeprazole (PRILOSEC) 40 MG capsule Take 40 mg by mouth daily. 12/15/16   [provider]    Current Facility-Administered Medications  Medication Dose Route Frequency Provider Last Rate Last Admin   acetaminophen (TYLENOL) tablet 650 mg  650 mg Oral Q6H PRN Adefeso, Oladapo, DO   650 mg at 08/26/22 1610   Or   acetaminophen (TYLENOL) suppository 650 mg  650 mg Rectal Q6H PRN Adefeso, Oladapo, DO       atorvastatin (LIPITOR) tablet 10 mg  10 mg Oral Daily Adefeso, Oladapo, DO   10 mg at 08/26/22 0944   cholecalciferol (VITAMIN D3) 25 MCG (1000 UNIT) tablet 1,000 Units  1,000 Units Oral Daily Adefeso, Oladapo, DO   1,000 Units at 08/26/22 0945   cyanocobalamin (VITAMIN B12) tablet 500 mcg  500 mcg Oral Daily Adefeso, Oladapo, DO   500 mcg at 08/26/22 0944   insulin aspart (novoLOG) injection 0-9 Units  0-9 Units Subcutaneous TID WC Adefeso, Oladapo, DO       lisinopril (ZESTRIL) tablet 10 mg  10 mg Oral Daily Adefeso, Oladapo, DO   10 mg at 08/26/22 0943   metoprolol succinate (TOPROL-XL) 24 hr tablet 50 mg  50 mg Oral Daily Adefeso, Oladapo, DO   50 mg at 08/26/22 0936   ondansetron (ZOFRAN) tablet 4 mg  4 mg Oral Q6H PRN Adefeso, Oladapo, DO       Or   ondansetron (ZOFRAN) injection 4 mg  4 mg Intravenous Q6H PRN Adefeso, Oladapo, DO       pantoprazole (PROTONIX) injection 40 mg  40 mg Intravenous Q12H Adefeso, Oladapo, DO   40 mg at 08/26/22 0946   potassium chloride SA (KLOR-CON M) CR tablet 40 mEq  40 mEq Oral BID Maurilio Lovely D, DO   40 mEq at 08/26/22 9604    Allergies as of 08/25/2022   (No  Known Allergies)    Family History  Problem Relation Age of Onset   Congestive Heart Failure Father    Stroke Mother    Leukemia Brother    Heart attack Sister    Hypertension Sister    Diabetes Sister    Other Brother        double pneumonia; paralyzed   Kidney disease Brother    Diabetes Brother    Hypertension Brother    Other Brother        on dialysis   Hypertension Sister    Diabetes Sister    Hypertension Sister    Diabetes Sister    Stroke Sister    Hypertension Daughter    Hypertension Daughter    Colon cancer Neg Hx    Colon polyps Neg Hx     Social History   Socioeconomic History   Marital  status: Single    Spouse name: Not on file   Number of children: Not on file   Years of education: Not on file   Highest education level: Not on file  Occupational History   Not on file  Tobacco Use   Smoking status: Never    Passive exposure: Never   Smokeless tobacco: Never  Vaping Use   Vaping Use: Never used  Substance and Sexual Activity   Alcohol use: No   Drug use: No   Sexual activity: Yes    Birth control/protection: Post-menopausal  Other Topics Concern   Not on file  Social History Narrative   ONE PARTNER.    Social Determinants of Health   Financial Resource Strain: Low Risk  (06/15/2020)   Overall Financial Resource Strain (CARDIA)    Difficulty of Paying Living Expenses: Not hard at all  Food Insecurity: No Food Insecurity (06/15/2020)   Hunger Vital Sign    Worried About Running Out of Food in the Last Year: Never true    Ran Out of Food in the Last Year: Never true  Transportation Needs: No Transportation Needs (06/15/2020)   PRAPARE - Administrator, Civil Service (Medical): No    Lack of Transportation (Non-Medical): No  Physical Activity: Insufficiently Active (06/15/2020)   Exercise Vital Sign    Days of Exercise per Week: 1 day    Minutes of Exercise per Session: 10 min  Stress: No Stress Concern Present (06/15/2020)    [employer] of Occupational Health - Occupational Stress Questionnaire    Feeling of Stress : Not at all  Social Connections: Moderately Integrated (06/15/2020)   Social Connection and Isolation Panel [NHANES]    Frequency of Communication with Friends and Family: More than three times a week    Frequency of Social Gatherings with Friends and Family: Twice a week    Attends Religious Services: More than 4 times per year    Active Member of [employer] or Organizations: Yes    Attends Banker Meetings: More than 4 times per year    Marital Status: Widowed  Intimate Partner Violence: Not At Risk (06/15/2020)   Humiliation, Afraid, Rape, and Kick questionnaire    Fear of Current or Ex-Partner: No    Emotionally Abused: No    Physically Abused: No    Sexually Abused: No    Review of Systems: Gen: Denies fever, chills, cold or flulike symptoms, presyncope, syncope. CV: Denies chest pain, heart palpitations. Resp: Denies shortness of breath, cough. GI: See HPI GU : Denies urinary burning, urinary frequency, urinary incontinence.  MS: Denies joint pain. Derm: Denies rash. Psych: Denies depression, anxiety. Heme: See HPI  Physical Exam: Vital signs in last 24 hours: Temp:  [97.7 F (36.5 C)-98.5 F (36.9 C)] 98.3 F (36.8 C) (05/21 0942) Pulse Rate:  [69-76] 69 (05/21 0942) Resp:  [16-18] 18 (05/21 0942) BP: (137-187)/(60-81) 161/81 (05/21 0942) SpO2:  [96 %-100 %] 100 % (05/21 0942) FiO2 (%):  [21 %] 21 % (05/20 2357) Weight:  [112.5 kg] 112.5 kg (05/20 1751) Last BM Date : 08/26/22 General:   Alert,  Well-developed, well-nourished, pleasant and cooperative in NAD Head:  Normocephalic and atraumatic. Eyes:  Sclera clear, no icterus.   Conjunctiva pink. Ears:  Normal auditory acuity. Lungs:  Clear throughout to auscultation.   No wheezes, crackles, or rhonchi. No acute distress. Heart:  Regular rate and rhythm; no murmurs, clicks, rubs,  or gallops. Abdomen:  Soft,  nontender and  nondistended. No masses, hepatosplenomegaly or hernias noted. Normal bowel sounds, without guarding, and without rebound.   Rectal:  Deferred until time of colonoscopy.   Msk:  Symmetrical without gross deformities. Normal posture. Extremities:  Without edema. Neurologic:  Alert and  oriented x4;  grossly normal neurologically. Skin:  Intact without significant lesions or rashes. Psych:  Normal mood and affect.  Intake/Output from previous day: No intake/output data recorded. Intake/Output this shift: No intake/output data recorded.  Lab Results: Recent Labs    08/25/22 1821 08/25/22 2130 08/26/22 0444  WBC 5.7  --  4.4  HGB 10.9* 10.2* 10.0*  HCT 35.1* 32.4* 31.5*  PLT 189  --  175   BMET Recent Labs    08/25/22 1821 08/26/22 0444  NA 140 139  K 3.7 3.4*  CL 102 105  CO2 30 27  GLUCOSE 89 102*  BUN 23 19  CREATININE 1.13* 0.91  CALCIUM 8.8* 8.4*   LFT Recent Labs    08/26/22 0444  PROT 6.5  ALBUMIN 3.1*  AST 17  ALT 15  ALKPHOS 65  BILITOT 0.6    Impression: 72 y.o. year old female with medical history significant for HTN, HLD, moderate aortic valve stenosis, type 2 diabetes, CKD, GERD, dysphagia, who presented to the emergency room due to rectal bleeding, GI consulted for further evaluation.  Rectal bleeding/anemia: Acute onset bright red blood per rectum with clots yesterday with numerous episodes. No associated abdominal pain, nausea, vomiting, diarrhea. In fact, she was passing no stool.  Rectal bleeding seems to be tapering off today.  She had 1 bowel movement this morning with some clots of blood on the stool.  Hemoglobin was down to 10.9 yesterday from 12.1, 1 year ago.  Hemoglobin 10.0 this morning.  Denies NSAIDs.  Last colonoscopy in 2017 with perianal skin tag, diverticulosis, nonbleeding internal hemorrhoids.  Last EGD in 2022 with multiple hyperplastic gastric polyps, gastritis.  Suspect we are likely dealing with a diverticular bleed.   Doubt upper GI bleed. Will plan for colonoscopy for further evaluation.    Plan: Clear liquid diet today. N.p.o. at midnight. Will attempt GoLytely prep with Zofran given prior to prep initiation due to patient reporting nausea/vomiting with GoLytely in the past. Plan for colonoscopy with propofol with Dr. Jena Gauss tomorrow The risks, benefits, and alternatives have been discussed with the patient in detail. The patient states understanding and desires to proceed.  Continue to monitor H&H closely.  Transfuse for hemoglobin less than 7.   LOS: 1 day    08/26/2022, 10:11 AM   Ermalinda Memos, PA-C [facility]

## 2022-08-26 NOTE — Consult Note (Signed)
@LOGO@   Referring Provider: [facility] Primary Care Physician:  [facility] Primary Gastroenterologist:  Dr. Carver  Date of Admission: 08/25/2022 Date of Consultation: 08/26/2022  Reason for Consultation: GI bleed  HPI:  Stacey Mcneil is a 72 y.o. year old female with medical history significant for HTN, HLD, moderate aortic valve stenosis, type 2 diabetes, CKD, GERD, dysphagia, who presented to the emergency room due to rectal bleeding.  ED course: Hemodynamically stable. Hemoglobin 10.9. BUN within normal limits. Fecal occult blood positive. ED provider reached out to Dr. Carver who recommended admission with plans for colonoscopy on Wednesday. She started on IV PPI twice daily and placed on clear liquid diet.   Today: Hemoglobin 10.0.  Acute onset rectal bleeding starting yesterday morning. 4 episodes at home, 7 episodes here at the hospital yesterday. States she was passing red blood only. No stool. 1 BM this morning with solid stool and some clots of blood on the stool. No history of rectal bleeding in the past. No abdominal pain. No nausea, vomiting. Bowels were moving normally prior to this.   GERD had been well controled on omeprazole.   No NSAIDs.   Doesn't tolerate golytely well but is willing to try.   Last colonoscopy in [facility], dated October 2017, perianal skin tag found on the perianal area, diverticulosis, nonbleeding internal hemorrhoids. Quality of the prep was good.  Repeat colonoscopy in 10 years.   Last EGD August 2022 with multiple gastric polyps (hyperplastic), gastritis with slight chronic inflammation, no H. pylori, normal examined duodenum.  Past Medical History:  Diagnosis Date   Anemia    Chronic kidney disease    Diabetes mellitus without complication (HCC)    Hypertension     Past Surgical History:  Procedure Laterality Date   BALLOON DILATION N/A 11/12/2020   Procedure: BALLOON DILATION;   Surgeon: Carver, Charles K, DO;  [facility];  Service: Endoscopy;  Laterality: N/A;   BIOPSY  11/12/2020   Procedure: BIOPSY;  Surgeon: Carver, Charles K, DO;  [facility]: [facility];  Service: Endoscopy;;   COLONOSCOPY  01/2016   [facility]: Perianal skin tag found in the perianal area, diverticulosis, nonbleeding internal hemorrhoids.  Quality of prep was good.  Next colonoscopy 10 years   ESOPHAGOGASTRODUODENOSCOPY N/A 01/20/2017   Fields: Mild Schatzki ring status post dilation, gastritis (benign pathology with no H. 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)   ESOPHAGOGASTRODUODENOSCOPY (EGD) WITH PROPOFOL N/A 11/12/2020   Procedure: ESOPHAGOGASTRODUODENOSCOPY (EGD) WITH PROPOFOL;  Surgeon: Carver, Charles K, DO;  Location: [facility];  Service: Endoscopy;  Laterality: N/A;  9:30am   SAVORY DILATION N/A 01/20/2017   Procedure: SAVORY DILATION;  Surgeon: Fields, Sandi L, MD;  Location: [facility];  Service: Endoscopy;  Laterality: N/A;    Prior to Admission medications   Medication Sig Start Date End Date Taking? Authorizing Provider  acetaminophen (TYLENOL) 650 MG CR tablet Take 650 mg by mouth every 8 (eight) hours as needed for pain.    [provider]  atorvastatin (LIPITOR) 10 MG tablet Take 10 mg by mouth daily.    [provider]  azelastine (ASTELIN) 0.1 % nasal spray Place 1 spray into both nostrils as needed. 11/27/21   [provider]  cholecalciferol (VITAMIN D3) 25 MCG (1000 UNIT) tablet Take 1,000 Units by mouth daily. 09/16/21   [provider]  Cyanocobalamin (B-12) 500 MCG TABS Take 500 mcg   by mouth daily.    [provider]  hydrochlorothiazide (HYDRODIURIL) 25 MG tablet Take 25 mg by mouth daily. 12/15/16   [provider]  lisinopril (PRINIVIL,ZESTRIL) 10 MG tablet Take 10 mg by mouth daily.  12/15/16   [provider]  metFORMIN (GLUCOPHAGE) 500 MG tablet Take 500  mg by mouth daily.    [provider]  metoprolol succinate (TOPROL-XL) 50 MG 24 hr tablet Take 50 mg by mouth daily. 12/15/16   [provider]  Multiple Vitamins-Minerals (MULTIVITAMIN WITH MINERALS) tablet Take 1 tablet by mouth daily.    [provider]  Omega-3 Fatty Acids (FISH OIL) 1200 MG CAPS Take 1,200 mg by mouth daily.     [provider]  omeprazole (PRILOSEC) 40 MG capsule Take 40 mg by mouth daily. 12/15/16   [provider]    Current Facility-Administered Medications  Medication Dose Route Frequency Provider Last Rate Last Admin   acetaminophen (TYLENOL) tablet 650 mg  650 mg Oral Q6H PRN Adefeso, Oladapo, DO   650 mg at 08/26/22 0259   Or   acetaminophen (TYLENOL) suppository 650 mg  650 mg Rectal Q6H PRN Adefeso, Oladapo, DO       atorvastatin (LIPITOR) tablet 10 mg  10 mg Oral Daily Adefeso, Oladapo, DO   10 mg at 08/26/22 0944   cholecalciferol (VITAMIN D3) 25 MCG (1000 UNIT) tablet 1,000 Units  1,000 Units Oral Daily Adefeso, Oladapo, DO   1,000 Units at 08/26/22 0945   cyanocobalamin (VITAMIN B12) tablet 500 mcg  500 mcg Oral Daily Adefeso, Oladapo, DO   500 mcg at 08/26/22 0944   insulin aspart (novoLOG) injection 0-9 Units  0-9 Units Subcutaneous TID WC Adefeso, Oladapo, DO       lisinopril (ZESTRIL) tablet 10 mg  10 mg Oral Daily Adefeso, Oladapo, DO   10 mg at 08/26/22 0943   metoprolol succinate (TOPROL-XL) 24 hr tablet 50 mg  50 mg Oral Daily Adefeso, Oladapo, DO   50 mg at 08/26/22 0936   ondansetron (ZOFRAN) tablet 4 mg  4 mg Oral Q6H PRN Adefeso, Oladapo, DO       Or   ondansetron (ZOFRAN) injection 4 mg  4 mg Intravenous Q6H PRN Adefeso, Oladapo, DO       pantoprazole (PROTONIX) injection 40 mg  40 mg Intravenous Q12H Adefeso, Oladapo, DO   40 mg at 08/26/22 0946   potassium chloride SA (KLOR-CON M) CR tablet 40 mEq  40 mEq Oral BID Shah, Pratik D, DO   40 mEq at 08/26/22 0943    Allergies as of 08/25/2022   (No  Known Allergies)    Family History  Problem Relation Age of Onset   Congestive Heart Failure Father    Stroke Mother    Leukemia Brother    Heart attack Sister    Hypertension Sister    Diabetes Sister    Other Brother        double pneumonia; paralyzed   Kidney disease Brother    Diabetes Brother    Hypertension Brother    Other Brother        on dialysis   Hypertension Sister    Diabetes Sister    Hypertension Sister    Diabetes Sister    Stroke Sister    Hypertension Daughter    Hypertension Daughter    Colon cancer Neg Hx    Colon polyps Neg Hx     Social History   Socioeconomic History   Marital   status: Single    Spouse name: Not on file   Number of children: Not on file   Years of education: Not on file   Highest education level: Not on file  Occupational History   Not on file  Tobacco Use   Smoking status: Never    Passive exposure: Never   Smokeless tobacco: Never  Vaping Use   Vaping Use: Never used  Substance and Sexual Activity   Alcohol use: No   Drug use: No   Sexual activity: Yes    Birth control/protection: Post-menopausal  Other Topics Concern   Not on file  Social History Narrative   ONE PARTNER.    Social Determinants of Health   Financial Resource Strain: Low Risk  (06/15/2020)   Overall Financial Resource Strain (CARDIA)    Difficulty of Paying Living Expenses: Not hard at all  Food Insecurity: No Food Insecurity (06/15/2020)   Hunger Vital Sign    Worried About Running Out of Food in the Last Year: Never true    Ran Out of Food in the Last Year: Never true  Transportation Needs: No Transportation Needs (06/15/2020)   PRAPARE - Transportation    Lack of Transportation (Medical): No    Lack of Transportation (Non-Medical): No  Physical Activity: Insufficiently Active (06/15/2020)   Exercise Vital Sign    Days of Exercise per Week: 1 day    Minutes of Exercise per Session: 10 min  Stress: No Stress Concern Present (06/15/2020)    [employer] - Occupational Stress Questionnaire    Feeling of Stress : Not at all  Social Connections: Moderately Integrated (06/15/2020)   Social Connection and Isolation Panel [NHANES]    Frequency of Communication with Friends and Family: More than three times a week    Frequency of Social Gatherings with Friends and Family: Twice a week    Attends Religious Services: More than 4 times per year    Active Member of Clubs or Organizations: Yes    Attends Club or Organization Meetings: More than 4 times per year    Marital Status: Widowed  Intimate Partner Violence: Not At Risk (06/15/2020)   Humiliation, Afraid, Rape, and Kick questionnaire    Fear of Current or Ex-Partner: No    Emotionally Abused: No    Physically Abused: No    Sexually Abused: No    Review of Systems: Gen: Denies fever, chills, cold or flulike symptoms, presyncope, syncope. CV: Denies chest pain, heart palpitations. Resp: Denies shortness of breath, cough. GI: See HPI GU : Denies urinary burning, urinary frequency, urinary incontinence.  MS: Denies joint pain. Derm: Denies rash. Psych: Denies depression, anxiety. Heme: See HPI  Physical Exam: Vital signs in last 24 hours: Temp:  [97.7 F (36.5 C)-98.5 F (36.9 C)] 98.3 F (36.8 C) (05/21 0942) Pulse Rate:  [69-76] 69 (05/21 0942) Resp:  [16-18] 18 (05/21 0942) BP: (137-187)/(60-81) 161/81 (05/21 0942) SpO2:  [96 %-100 %] 100 % (05/21 0942) FiO2 (%):  [21 %] 21 % (05/20 2357) Weight:  [112.5 kg] 112.5 kg (05/20 1751) Last BM Date : 08/26/22 General:   Alert,  Well-developed, well-nourished, pleasant and cooperative in NAD Head:  Normocephalic and atraumatic. Eyes:  Sclera clear, no icterus.   Conjunctiva pink. Ears:  Normal auditory acuity. Lungs:  Clear throughout to auscultation.   No wheezes, crackles, or rhonchi. No acute distress. Heart:  Regular rate and rhythm; no murmurs, clicks, rubs,  or gallops. Abdomen:  Soft,  nontender and   nondistended. No masses, hepatosplenomegaly or hernias noted. Normal bowel sounds, without guarding, and without rebound.   Rectal:  Deferred until time of colonoscopy.   Msk:  Symmetrical without gross deformities. Normal posture. Extremities:  Without edema. Neurologic:  Alert and  oriented x4;  grossly normal neurologically. Skin:  Intact without significant lesions or rashes. Psych:  Normal mood and affect.  Intake/Output from previous day: No intake/output data recorded. Intake/Output this shift: No intake/output data recorded.  Lab Results: Recent Labs    08/25/22 1821 08/25/22 2130 08/26/22 0444  WBC 5.7  --  4.4  HGB 10.9* 10.2* 10.0*  HCT 35.1* 32.4* 31.5*  PLT 189  --  175   BMET Recent Labs    08/25/22 1821 08/26/22 0444  NA 140 139  K 3.7 3.4*  CL 102 105  CO2 30 27  GLUCOSE 89 102*  BUN 23 19  CREATININE 1.13* 0.91  CALCIUM 8.8* 8.4*   LFT Recent Labs    08/26/22 0444  PROT 6.5  ALBUMIN 3.1*  AST 17  ALT 15  ALKPHOS 65  BILITOT 0.6    Impression: 71 y.o. year old female with medical history significant for HTN, HLD, moderate aortic valve stenosis, type 2 diabetes, CKD, GERD, dysphagia, who presented to the emergency room due to rectal bleeding, GI consulted for further evaluation.  Rectal bleeding/anemia: Acute onset bright red blood per rectum with clots yesterday with numerous episodes. No associated abdominal pain, nausea, vomiting, diarrhea. In fact, she was passing no stool.  Rectal bleeding seems to be tapering off today.  She had 1 bowel movement this morning with some clots of blood on the stool.  Hemoglobin was down to 10.9 yesterday from 12.1, 1 year ago.  Hemoglobin 10.0 this morning.  Denies NSAIDs.  Last colonoscopy in 2017 with perianal skin tag, diverticulosis, nonbleeding internal hemorrhoids.  Last EGD in 2022 with multiple hyperplastic gastric polyps, gastritis.  Suspect we are likely dealing with a diverticular bleed.   Doubt upper GI bleed. Will plan for colonoscopy for further evaluation.    Plan: Clear liquid diet today. N.p.o. at midnight. Will attempt GoLytely prep with Zofran given prior to prep initiation due to patient reporting nausea/vomiting with GoLytely in the past. Plan for colonoscopy with propofol with Dr. Rourk tomorrow The risks, benefits, and alternatives have been discussed with the patient in detail. The patient states understanding and desires to proceed.  Continue to monitor H&H closely.  Transfuse for hemoglobin less than 7.   LOS: 1 day    08/26/2022, 10:11 AM   Mc Bloodworth, PA-C [facility]   

## 2022-08-26 NOTE — Care Management CC44 (Signed)
Condition Code 44 Documentation Completed  Patient Details  Name: Stacey Mcneil MRN: [id number] Date of Birth: November 27, 1950   Condition Code 44 given:  Yes Patient signature on Condition Code 44 notice:  Yes Documentation of 2 MD's agreement:  Yes Code 44 added to claim:  Yes    Karn Cassis, LCSW 08/26/2022, 2:19 PM

## 2022-08-26 NOTE — Care Management Obs Status (Signed)
MEDICARE OBSERVATION STATUS NOTIFICATION   Patient Details  Name: Stacey Mcneil MRN: [id number] Date of Birth: November 19, 1950   Medicare Observation Status Notification Given:  Yes    Karn Cassis, LCSW 08/26/2022, 2:19 PM

## 2022-08-27 ENCOUNTER — Observation Stay (HOSPITAL_COMMUNITY): Payer: Medicare PPO | Admitting: Anesthesiology

## 2022-08-27 ENCOUNTER — Encounter (HOSPITAL_COMMUNITY): Payer: Self-pay | Admitting: Internal Medicine

## 2022-08-27 ENCOUNTER — Encounter (HOSPITAL_COMMUNITY)
Admission: EM | Disposition: A | Discharge status: Home / Self Care | Payer: Self-pay | Source: Home / Self Care | Attending: Family Medicine

## 2022-08-27 ENCOUNTER — Observation Stay (HOSPITAL_BASED_OUTPATIENT_CLINIC_OR_DEPARTMENT_OTHER): Payer: Medicare PPO | Admitting: Anesthesiology

## 2022-08-27 DIAGNOSIS — K5731 Diverticulosis of large intestine without perforation or abscess with bleeding: Secondary | ICD-10-CM

## 2022-08-27 DIAGNOSIS — Z7984 Long term (current) use of oral hypoglycemic drugs: Secondary | ICD-10-CM | POA: Diagnosis not present

## 2022-08-27 DIAGNOSIS — K921 Melena: Secondary | ICD-10-CM

## 2022-08-27 DIAGNOSIS — K922 Gastrointestinal hemorrhage, unspecified: Secondary | ICD-10-CM | POA: Diagnosis not present

## 2022-08-27 DIAGNOSIS — Z8249 Family history of ischemic heart disease and other diseases of the circulatory system: Secondary | ICD-10-CM | POA: Diagnosis not present

## 2022-08-27 DIAGNOSIS — E119 Type 2 diabetes mellitus without complications: Secondary | ICD-10-CM | POA: Diagnosis not present

## 2022-08-27 DIAGNOSIS — Z833 Family history of diabetes mellitus: Secondary | ICD-10-CM | POA: Diagnosis not present

## 2022-08-27 DIAGNOSIS — Z6841 Body Mass Index (BMI) 40.0 and over, adult: Secondary | ICD-10-CM | POA: Diagnosis not present

## 2022-08-27 DIAGNOSIS — I350 Nonrheumatic aortic (valve) stenosis: Secondary | ICD-10-CM | POA: Diagnosis not present

## 2022-08-27 DIAGNOSIS — K219 Gastro-esophageal reflux disease without esophagitis: Secondary | ICD-10-CM | POA: Diagnosis present

## 2022-08-27 DIAGNOSIS — D649 Anemia, unspecified: Secondary | ICD-10-CM | POA: Diagnosis present

## 2022-08-27 DIAGNOSIS — K625 Hemorrhage of anus and rectum: Secondary | ICD-10-CM | POA: Diagnosis not present

## 2022-08-27 DIAGNOSIS — K5730 Diverticulosis of large intestine without perforation or abscess without bleeding: Secondary | ICD-10-CM

## 2022-08-27 DIAGNOSIS — K648 Other hemorrhoids: Secondary | ICD-10-CM | POA: Diagnosis not present

## 2022-08-27 DIAGNOSIS — E876 Hypokalemia: Secondary | ICD-10-CM | POA: Diagnosis not present

## 2022-08-27 DIAGNOSIS — E782 Mixed hyperlipidemia: Secondary | ICD-10-CM | POA: Diagnosis present

## 2022-08-27 DIAGNOSIS — I129 Hypertensive chronic kidney disease with stage 1 through stage 4 chronic kidney disease, or unspecified chronic kidney disease: Secondary | ICD-10-CM | POA: Diagnosis present

## 2022-08-27 DIAGNOSIS — I10 Essential (primary) hypertension: Secondary | ICD-10-CM | POA: Diagnosis not present

## 2022-08-27 DIAGNOSIS — Z79899 Other long term (current) drug therapy: Secondary | ICD-10-CM | POA: Diagnosis not present

## 2022-08-27 DIAGNOSIS — Z841 Family history of disorders of kidney and ureter: Secondary | ICD-10-CM | POA: Diagnosis not present

## 2022-08-27 DIAGNOSIS — N1831 Chronic kidney disease, stage 3a: Secondary | ICD-10-CM | POA: Diagnosis present

## 2022-08-27 DIAGNOSIS — E1122 Type 2 diabetes mellitus with diabetic chronic kidney disease: Secondary | ICD-10-CM | POA: Diagnosis present

## 2022-08-27 HISTORY — PX: COLONOSCOPY WITH PROPOFOL: SHX5780

## 2022-08-27 LAB — CBC
HCT: 31.2 % — ABNORMAL LOW (ref 36.0–46.0)
Hemoglobin: 9.9 g/dL — ABNORMAL LOW (ref 12.0–15.0)
MCH: 28 pg (ref 26.0–34.0)
MCHC: 31.7 g/dL (ref 30.0–36.0)
MCV: 88.1 fL (ref 80.0–100.0)
Platelets: 172 10*3/uL (ref 150–400)
RBC: 3.54 MIL/uL — ABNORMAL LOW (ref 3.87–5.11)
RDW: 13.8 % (ref 11.5–15.5)
WBC: 4.5 10*3/uL (ref 4.0–10.5)
nRBC: 0 % (ref 0.0–0.2)

## 2022-08-27 LAB — BASIC METABOLIC PANEL
Anion gap: 6 (ref 5–15)
BUN: 10 mg/dL (ref 8–23)
CO2: 26 mmol/L (ref 22–32)
Calcium: 8.3 mg/dL — ABNORMAL LOW (ref 8.9–10.3)
Chloride: 107 mmol/L (ref 98–111)
Creatinine, Ser: 0.87 mg/dL (ref 0.44–1.00)
GFR, Estimated: >60 mL/min (ref >60)
Glucose, Bld: 111 mg/dL — ABNORMAL HIGH (ref 70–99)
Potassium: 4 mmol/L (ref 3.5–5.1)
Sodium: 139 mmol/L (ref 135–145)

## 2022-08-27 LAB — GLUCOSE, CAPILLARY
Glucose-Capillary: 116 mg/dL — ABNORMAL HIGH (ref 70–99)
Glucose-Capillary: 91 mg/dL (ref 70–99)
Glucose-Capillary: 70 mg/dL (ref 70–99)
Glucose-Capillary: 105 mg/dL — ABNORMAL HIGH (ref 70–99)
Glucose-Capillary: 105 mg/dL — ABNORMAL HIGH (ref 70–99)
Glucose-Capillary: 110 mg/dL — ABNORMAL HIGH (ref 70–99)

## 2022-08-27 LAB — MAGNESIUM: Magnesium: 1.8 mg/dL (ref 1.7–2.4)

## 2022-08-27 SURGERY — COLONOSCOPY WITH PROPOFOL
Anesthesia: General

## 2022-08-27 MED ORDER — DEXTROSE 50 % IV SOLN
INTRAVENOUS | Status: AC
  Filled 2022-08-27: qty 50

## 2022-08-27 MED ORDER — LACTATED RINGERS IV SOLN: INTRAVENOUS | Status: DC

## 2022-08-27 MED ORDER — STERILE WATER FOR IRRIGATION IR SOLN
Status: DC | PRN
  Administered 2022-08-27: 50 mL

## 2022-08-27 MED ORDER — PROPOFOL 10 MG/ML IV BOLUS
INTRAVENOUS | Status: DC | PRN
  Administered 2022-08-27: 30 mg via INTRAVENOUS
  Administered 2022-08-27: 80 mg via INTRAVENOUS

## 2022-08-27 MED ORDER — PROPOFOL 500 MG/50ML IV EMUL
INTRAVENOUS | Status: DC | PRN
  Administered 2022-08-27: 150 ug/kg/min via INTRAVENOUS

## 2022-08-27 MED ORDER — SODIUM CHLORIDE 0.9 % IV SOLN: INTRAVENOUS | Status: DC

## 2022-08-27 MED ORDER — DEXTROSE 50 % IV SOLN
12.5000 g | Freq: Once | INTRAVENOUS | Status: AC
  Administered 2022-08-27: 12.5 g via INTRAVENOUS

## 2022-08-27 NOTE — Anesthesia Postprocedure Evaluation (Signed)
Anesthesia Post Note  Patient: Stacey Mcneil  Procedure(s) Performed: COLONOSCOPY WITH PROPOFOL  Patient location during evaluation: Phase II Anesthesia Type: General Level of consciousness: awake and alert and oriented Pain management: pain level controlled Vital Signs Assessment: post-procedure vital signs reviewed and stable Respiratory status: spontaneous breathing, nonlabored ventilation and respiratory function stable Cardiovascular status: blood pressure returned to baseline and stable Postop Assessment: no apparent nausea or vomiting Anesthetic complications: no  No notable events documented.   Last Vitals:  Vitals:   08/27/22 1549 08/27/22 1600  BP:    Pulse: (!) 58   Resp: 20   Temp:  (!) 36.4 C  SpO2: 100%     Last Pain:  Vitals:   08/27/22 1549  TempSrc:   PainSc: 2                  [name] C Amaryllis Malmquist

## 2022-08-27 NOTE — Transfer of Care (Signed)
Immediate Anesthesia Transfer of Care Note  Patient: Stacey Mcneil  Procedure(s) Performed: COLONOSCOPY WITH PROPOFOL  Patient Location: PACU  Anesthesia Type:General  Level of Consciousness: drowsy  Airway & Oxygen Therapy: Patient Spontanous Breathing  Post-op Assessment: Report given to RN and Post -op Vital signs reviewed and stable  Post vital signs: Reviewed and stable  Last Vitals:  Vitals Value Taken Time  BP 95/48   Temp    Pulse 71 08/27/22 1530  Resp 19 08/27/22 1530  SpO2 98 % 08/27/22 1530  Vitals shown include unvalidated device data.  Last Pain:  Vitals:   08/27/22 1457  TempSrc:   PainSc: 0-No pain         Complications: No notable events documented.

## 2022-08-27 NOTE — Interval H&P Note (Signed)
History and Physical Interval Note:  08/27/2022 2:47 PM  Stacey Mcneil  has presented today for surgery, with the diagnosis of Rectal bleeding, anemia.  The various methods of treatment have been discussed with the patient and family. After consideration of risks, benefits and other options for treatment, the patient has consented to  Procedure(s): COLONOSCOPY WITH PROPOFOL (N/A) as a surgical intervention.  The patient's history has been reviewed, patient examined, no change in status, stable for surgery.  I have reviewed the patient's chart and labs.  Questions were answered to the patient's satisfaction.     Molly Maduro Wyona Neils    Seen in short stay.  No change.  Hemoglobin 9.9 this morning.  Bleeding is tapered off.  Patient denies abdominal pain agree with need for colonoscopy  The risks, benefits, limitations, alternatives and imponderables have been reviewed with the patient. Questions have been answered. All parties are agreeable.

## 2022-08-27 NOTE — Progress Notes (Signed)
PROGRESS NOTE    Stacey Mcneil  ZOX:[id number] DOB: 11-11-1950 DOA: 08/25/2022 PCP: [facility]  Subjective: The patient was seen and examined this morning, stable no acute distress, reporting of no further rectal bleeding overnight. Hemodynamically stable  GI planning to proceed with colonoscopy    Brief Narrative:  Stacey Mcneil is a 72 y.o. female with medical history significant of hypertension, hyperlipidemia, type 2 diabetes mellitus, GERD, CKD 3A who presents to the emergency department due to rectal bleed.  Her hemoglobin levels have remained stable and she has been assessed by GI today with plans for colonoscopy in AM.  Assessment & Plan:   Principal Problem:   Rectal bleeding Active Problems:   Morbid obesity (HCC)   GERD (gastroesophageal reflux disease)   Essential hypertension   Mixed hyperlipidemia   Type 2 diabetes mellitus with chronic kidney disease, without long-term current use of insulin (HCC)  Assessment and Plan:   Rectal bleeding    Latest Ref Rng & Units 08/27/2022    4:52 AM 08/26/2022    4:44 AM 08/25/2022    9:30 PM  CBC  WBC 4.0 - 10.5 K/uL 4.5  4.4    Hemoglobin 12.0 - 15.0 g/dL 9.9  81.1  91.4   Hematocrit 36.0 - 46.0 % 31.2  31.5  32.4   Platelets 150 - 400 K/uL 172  175     -Minimum concern for malignancy, likely due to diverticular versus hemorrhoid Continue Protonix -Per GI Colonoscopy  N.p.o.   Mild hypokalemia Replete and reevaluate in a.m.   Essential hypertension Continue lisinopril, Toprol-XL   Mixed hyperlipidemia Continue Lipitor   Type 2 diabetes mellitus Continue ISS and hypoglycemia protocol Metformin will be held at this time   GERD Continue Protonix   CKD 3A Creatinine 1.13 (creatinine is within baseline range) eGFR 53 Renally adjust medications, avoid nephrotoxic agents/dehydration/hypotension   Morbid obesity (BMI 46.10) Diet and lifestyle modification    DVT  prophylaxis:SCDs Code Status: Full Family Communication: None at bedside Disposition Plan:  Status is: Inpatient Remains inpatient appropriate because: Need for inpatient procedure and IV medications.   Consultants:  GI  Procedures:  None  Antimicrobials:  None     Objective: Vitals:   08/26/22 1514 08/26/22 2055 08/27/22 0505 08/27/22 1000  BP: 136/73 (!) 148/56 (!) 127/56 123/68  Pulse: 62 61 70 65  Resp: 18 20 18 18   Temp: 98.3 F (36.8 C)  98 F (36.7 C)   TempSrc: Oral Oral Oral   SpO2: 100% 100% 99% 100%  Weight:      Height:        Intake/Output Summary (Last 24 hours) at 08/27/2022 1256 Last data filed at 08/26/2022 1700 Gross per 24 hour  Intake 240 ml  Output --  Net 240 ml   Filed Weights   08/25/22 1751  Weight: 112.5 kg        General:  AAO x 3,  cooperative, no distress;   HEENT:  Normocephalic, PERRL, otherwise with in Normal limits   Neuro:  CNII-XII intact. , normal motor and sensation, reflexes intact   Lungs:   Clear to auscultation BL, Respirations unlabored,  No wheezes / crackles  Cardio:    S1/S2, RRR, No murmure, No Rubs or Gallops   Abdomen:  Soft, non-tender, bowel sounds active all four quadrants, no guarding or peritoneal signs.  Muscular  skeletal:  Limited exam -global generalized weaknesses - in bed, able to move all 4 extremities,  2+ pulses,  symmetric, No pitting edema  Skin:  Dry, warm to touch, negative for any Rashes,  Wounds: Please see nursing documentation           Data Reviewed: I have personally reviewed following labs and imaging studies  CBC: Recent Labs  Lab 08/25/22 1821 08/25/22 2130 08/26/22 0444 08/27/22 0452  WBC 5.7  --  4.4 4.5  NEUTROABS 3.0  --   --   --   HGB 10.9* 10.2* 10.0* 9.9*  HCT 35.1* 32.4* 31.5* 31.2*  MCV 88.9  --  88.2 88.1  PLT 189  --  175 172   Basic Metabolic Panel: Recent Labs  Lab 08/25/22 1821 08/26/22 0444 08/27/22 0452  NA 140 139 139  K 3.7 3.4* 4.0   CL 102 105 107  CO2 30 27 26   GLUCOSE 89 102* 111*  BUN 23 19 10   CREATININE 1.13* 0.91 0.87  CALCIUM 8.8* 8.4* 8.3*  MG  --  1.9 1.8  PHOS  --  3.7  --    GFR: Estimated Creatinine Clearance: 71.7 mL/min (by C-G formula based on SCr of 0.87 mg/dL). Liver Function Tests: Recent Labs  Lab 08/26/22 0444  AST 17  ALT 15  ALKPHOS 65  BILITOT 0.6  PROT 6.5  ALBUMIN 3.1*    HbA1C: Recent Labs    08/26/22 0444  HGBA1C 5.9*   CBG: Recent Labs  Lab 08/26/22 1709 08/26/22 1814 08/26/22 2059 08/27/22 0735 08/27/22 1126  GLUCAP 62* 130* 87 116* 91      Radiology Studies: No results found.      Scheduled Meds:  atorvastatin  10 mg Oral Daily   cholecalciferol  1,000 Units Oral Daily   cyanocobalamin  500 mcg Oral Daily   insulin aspart  0-9 Units Subcutaneous TID WC   lisinopril  10 mg Oral Daily   metoprolol succinate  50 mg Oral Daily   pantoprazole (PROTONIX) IV  40 mg Intravenous Q12H     LOS: 1 day    Time spent: 35 minutes    Jaksen Fiorella A Decorian Schuenemann,MD  [facility]  If 7PM-7AM, please contact night-coverage [email or web address] 08/27/2022, 12:56 PM

## 2022-08-27 NOTE — Progress Notes (Signed)
Patient given second tap water enema, yellow/serosanguinous output noted. Patient tolerated well.

## 2022-08-27 NOTE — Op Note (Signed)
[facility] Patient Name: Stacey Mcneil Procedure Date: 08/27/2022 2:41 PM MRN: [id number] Date of Birth: 1950/07/25 Attending MD: Gennette Pac , MD, [id number] CSN: [id number] Age: 72 Admit Type: Inpatient Procedure:                Colonoscopy Indications:              Hematochezia Providers:                Gennette Pac, MD, Buel Ream. Thomasena Edis RN, RN,                            Lennice Sites Technician, Technician Referring MD:              Medicines:                Propofol per Anesthesia Complications:            No immediate complications. Estimated Blood Loss:     Estimated blood loss: none. Procedure:                Pre-Anesthesia Assessment:                           - Prior to the procedure, a History and Physical                            was performed, and patient medications and                            allergies were reviewed. The patient's tolerance of                            previous anesthesia was also reviewed. The risks                            and benefits of the procedure and the sedation                            options and risks were discussed with the patient.                            All questions were answered, and informed consent                            was obtained. Prior Anticoagulants: The patient has                            taken no anticoagulant or antiplatelet agents. ASA                            Grade Assessment: III - A patient with severe                            systemic disease. After reviewing the risks and  benefits, the patient was deemed in satisfactory                            condition to undergo the procedure.                           After obtaining informed consent, the colonoscope                            was passed under direct vision. Throughout the                            procedure, the patient's blood pressure, pulse, and                            oxygen  saturations were monitored continuously. The                            [id number]) scope was introduced through the                            anus and advanced to the the cecum, identified by                            appendiceal orifice and ileocecal valve. The                            colonoscopy was performed without difficulty. The                            patient tolerated the procedure well. The quality                            of the bowel preparation was adequate. The                            ileocecal valve, appendiceal orifice, and rectum                            were photographed. Scope In: 3:01:39 PM Scope Out: 3:25:54 PM Scope Withdrawal Time: 0 hours 17 minutes 27 seconds  Total Procedure Duration: 0 hours 24 minutes 15 seconds  Findings:      The perianal and digital rectal examinations were normal. Blood and clot       in the rectum trailed into the proximal descending segment and then       tapered off; the more proximal colon all the way to the cecum appeared       normal. There was no blood in the more proximal colon. I copiously       washed and lavaged the blood and clot throughout the descending sigmoid       and rectal segments. I came all the way out and went back in and once       again. Although there was clot and blood stained mucus from the rectum       all  the way up to the proximal descending segment, there was no actively       or suspect bleeding lesion. There was clot and numerous diverticula in       the sigmoid segment. No suspect lesion was identified. Rectal vault was       well-seen on face. Too small to retroflex. Minimal internal hemorrhoids.      Many medium-mouthed diverticula were found in the sigmoid colon and       descending colon.      The exam was otherwise without abnormality. Redundant colon requiring       external abdominal pressure to reach the cecum. Impression:               - Diverticulosis in the sigmoid colon  and in the                            descending colon.                           -Old blood and clot throughout the rectum,                            descending and sigmoid segments. No actively                            bleeding lesion found                           -I suspect bleeding secondary to diverticula.                           -. Moderate Sedation:      Moderate (conscious) sedation was personally administered by an       anesthesia professional. The following parameters were monitored: oxygen       saturation, heart rate, blood pressure, respiratory rate, EKG, adequacy       of pulmonary ventilation, and response to care. Recommendation:           - Return patient to hospital ward for observation.                           - Clear liquid diet.                           - Continue present medications.                           - No repeat colonoscopy due to age..                           - Return to GI clinic in 1 month. Repeat H&H                            tomorrow morning Procedure Code(s):        --- Professional ---                           [phone], Colonoscopy, flexible; diagnostic, including  collection of specimen(s) by brushing or washing,                            when performed (separate procedure) Diagnosis Code(s):        --- Professional ---                           K92.1, Melena (includes Hematochezia)                           K57.30, Diverticulosis of large intestine without                            perforation or abscess without bleeding CPT copyright 2022 American [employer] Association. All rights reserved. The codes documented in this report are preliminary and upon coder review may  be revised to meet current compliance requirements. Gerrit Friends. Kaoru Benda, MD Gennette Pac, MD 08/27/2022 3:38:15 PM This report has been signed electronically. Number of Addenda: 0

## 2022-08-27 NOTE — Anesthesia Preprocedure Evaluation (Signed)
Anesthesia Evaluation  Patient identified by MRN, date of birth, ID band Patient awake    Reviewed: Allergy & Precautions, H&P , NPO status , Patient's Chart, lab work & pertinent test results, reviewed documented beta blocker date and time   Airway Mallampati: II  TM Distance: >3 FB Neck ROM: Full    Dental  (+) Dental Advisory Given   Pulmonary neg pulmonary ROS   Pulmonary exam normal breath sounds clear to auscultation       Cardiovascular Exercise Tolerance: Good hypertension, Pt. on medications and Pt. on home beta blockers + Valvular Problems/Murmurs  Rhythm:Regular Rate:Normal + Systolic murmurs Antoine Poche, MD 01/06/2022 12:11 PM EDT   Aortic valve is moderately stiff, this is called stenosis. This can happen with aging, the valve gets a little thicker and stiffer. Its not at a degree of concern but just something to monitor at this time, we would repeat an echo in 1 year. F/u with [place] 1 year.   Dominga Ferry MD     Neuro/Psych negative neurological ROS  negative psych ROS   GI/Hepatic Neg liver ROS,GERD  Medicated and Controlled,,  Endo/Other  diabetes, Well Controlled, Type 2, Oral Hypoglycemic Agents  Morbid obesity  Renal/GU Renal disease  negative genitourinary   Musculoskeletal negative musculoskeletal ROS (+)    Abdominal   Peds negative pediatric ROS (+)  Hematology  (+) Blood dyscrasia, anemia   Anesthesia Other Findings   Reproductive/Obstetrics negative OB ROS                             Anesthesia Physical Anesthesia Plan  ASA: 3  Anesthesia Plan: General   Post-op Pain Management: Minimal or no pain anticipated   Induction: Intravenous  PONV Risk Score and Plan: Propofol infusion  Airway Management Planned: Natural Airway and Nasal Cannula  Additional Equipment:   Intra-op Plan:   Post-operative Plan:   Informed Consent: I have reviewed the  patients History and Physical, chart, labs and discussed the procedure including the risks, benefits and alternatives for the proposed anesthesia with the patient or authorized representative who has indicated his/her understanding and acceptance.     Dental advisory given  Plan Discussed with: CRNA and Surgeon  Anesthesia Plan Comments:         Anesthesia Quick Evaluation

## 2022-08-28 ENCOUNTER — Telehealth: Payer: Self-pay | Admitting: Gastroenterology

## 2022-08-28 DIAGNOSIS — K625 Hemorrhage of anus and rectum: Secondary | ICD-10-CM | POA: Diagnosis not present

## 2022-08-28 DIAGNOSIS — K922 Gastrointestinal hemorrhage, unspecified: Secondary | ICD-10-CM | POA: Diagnosis not present

## 2022-08-28 LAB — CBC
HCT: 30 % — ABNORMAL LOW (ref 36.0–46.0)
Hemoglobin: 9.4 g/dL — ABNORMAL LOW (ref 12.0–15.0)
MCH: 27.6 pg (ref 26.0–34.0)
MCHC: 31.3 g/dL (ref 30.0–36.0)
MCV: 88.2 fL (ref 80.0–100.0)
Platelets: 177 10*3/uL (ref 150–400)
RBC: 3.4 MIL/uL — ABNORMAL LOW (ref 3.87–5.11)
RDW: 13.8 % (ref 11.5–15.5)
WBC: 4.3 10*3/uL (ref 4.0–10.5)
nRBC: 0 % (ref 0.0–0.2)

## 2022-08-28 LAB — GLUCOSE, CAPILLARY
Glucose-Capillary: 106 mg/dL — ABNORMAL HIGH (ref 70–99)
Glucose-Capillary: 85 mg/dL (ref 70–99)
Glucose-Capillary: 96 mg/dL (ref 70–99)
Glucose-Capillary: 127 mg/dL — ABNORMAL HIGH (ref 70–99)

## 2022-08-28 LAB — HEMOGLOBIN AND HEMATOCRIT, BLOOD
HCT: 33.8 % — ABNORMAL LOW (ref 36.0–46.0)
Hemoglobin: 10.5 g/dL — ABNORMAL LOW (ref 12.0–15.0)

## 2022-08-28 MED ORDER — SALINE SPRAY 0.65 % NA SOLN
1.0000 | NASAL | Status: DC | PRN
  Filled 2022-08-28: qty 44

## 2022-08-28 NOTE — Telephone Encounter (Signed)
Patient needs hospital follow up in one month with me.

## 2022-08-28 NOTE — Progress Notes (Signed)
   Gastroenterology Progress Note   Referring Provider: No ref. provider found Primary Care Physician:  [facility] Primary Gastroenterologist:  Hennie Duos. Marletta Lor, DO   Patient ID: Stacey Mcneil; [id number]; 12/03/1950   Subjective:    Hungry. Told she was going home tomorrow. Passed two small clots this morning. But no fresh blood.   Objective:   Vital signs in last 24 hours: Temp:  [97.5 F (36.4 C)-98.7 F (37.1 C)] 98.7 F (37.1 C) (05/23 1257) Pulse Rate:  [58-71] 68 (05/23 1257) Resp:  [17-20] 17 (05/23 1257) BP: (95-162)/(48-69) 143/60 (05/23 1257) SpO2:  [98 %-100 %] 100 % (05/23 1257) Last BM Date : 08/27/22 General:   Alert,  Well-developed, well-nourished, pleasant and cooperative in NAD Head:  Normocephalic and atraumatic. Eyes:  Sclera clear, no icterus.   Abdomen:  Soft, nontender and nondistended.    Extremities:  Without clubbing, deformity. trace edema. Neurologic:  Alert and  oriented x4;  grossly normal neurologically. Skin:  Intact without significant lesions or rashes. Psych:  Alert and cooperative. Normal mood and affect.  Intake/Output from previous day: 05/22 0701 - 05/23 0700 In: 1240 [P.O.:840; I.V.:400] Out: -  Intake/Output this shift: No intake/output data recorded.  Lab Results: CBC Recent Labs    08/26/22 0444 08/27/22 0452 08/28/22 0428 08/28/22 1112  WBC 4.4 4.5 4.3  --   HGB 10.0* 9.9* 9.4* 10.5*  HCT 31.5* 31.2* 30.0* 33.8*  MCV 88.2 88.1 88.2  --   PLT 175 172 177  --    BMET Recent Labs    08/25/22 1821 08/26/22 0444 08/27/22 0452  NA 140 139 139  K 3.7 3.4* 4.0  CL 102 105 107  CO2 30 27 26   GLUCOSE 89 102* 111*  BUN 23 19 10   CREATININE 1.13* 0.91 0.87  CALCIUM 8.8* 8.4* 8.3*   LFTs Recent Labs    08/26/22 0444  BILITOT 0.6  ALKPHOS 65  AST 17  ALT 15  PROT 6.5  ALBUMIN 3.1*   No results for input(s): "LIPASE" in the last 72 hours. PT/INR No results for input(s):  "LABPROT", "INR" in the last 72 hours.       Imaging Studies: No results found.[2 weeks]  Assessment:   72 y.o. year old female with medical history significant for HTN, HLD, moderate aortic valve stenosis, type 2 diabetes, CKD, GERD, dysphagia, who presented to the emergency room due to rectal bleeding, GI consulted for further evaluation.   Rectal bleeding/anemia: Painless rectal bleeding.  Presenting hemoglobin of 10.9.  Hemoglobin of 9.9 yesterday, 10.5 today.   Colonoscopy yesterday with diverticulosis in the sigmoid colon and the descending colon.  Old blood and clot throughout the rectum, descending and sigmoid segments.  No actively bleeding lesion found.  Suspected diverticular bleed. Patient passed two small clots today. Denies fresh blood.     Plan:   Advance diet.  Follow-up in the clinic in 1 month. GI to sign off. Reach out with questions.    LOS: 3 days   Leanna Battles. Dixon Boos [facility] [phone] 5/23/20241:06 PM

## 2022-08-28 NOTE — Progress Notes (Signed)
   08/28/22 1510  Spiritual Encounters  Type of Visit Initial  Care provided to: Patient  Reason for visit Routine spiritual support  OnCall Visit No   Chaplain visited with patient during routine rounding on the unit. The patient, Stacey Mcneil was in good spirits and happy for the visit. Her smile lights up the room. Stacey Mcneil shared a small part of her story with me as she expressed the joy she finds in the family that surrounds her.   Stacey Mcneil [facility]  [phone]

## 2022-08-28 NOTE — Progress Notes (Signed)
PROGRESS NOTE    Stacey Mcneil  OZH:[id number] DOB: 13-May-1950 DOA: 08/25/2022 PCP: [facility]  Subjective: The patient was seen and examined this morning, stable no acute distress Hemodynamically stable Reporting of passing clots earlier this morning  Status post colonoscopy which she tolerated well, finding was discussed with the patient in detail including diverticular disease and diverticular bleed.     Brief Narrative:  Stacey Mcneil is a 72 y.o. female with medical history significant of hypertension, hyperlipidemia, type 2 diabetes mellitus, GERD, CKD 3A who presents to the emergency department due to rectal bleed.  Her hemoglobin levels have remained stable and she has been assessed by GI today with plans for colonoscopy in AM.  Assessment & Plan:   Principal Problem:   Rectal bleeding Active Problems:   Morbid obesity (HCC)   GERD (gastroesophageal reflux disease)   Essential hypertension   Mixed hyperlipidemia   Type 2 diabetes mellitus with chronic kidney disease, without long-term current use of insulin (HCC)   GIB (gastrointestinal bleeding)  Assessment and Plan:   Rectal bleeding    Latest Ref Rng & Units 08/28/2022    4:28 AM 08/27/2022    4:52 AM 08/26/2022    4:44 AM  CBC  WBC 4.0 - 10.5 K/uL 4.3  4.5  4.4   Hemoglobin 12.0 - 15.0 g/dL 9.4  9.9  62.9   Hematocrit 36.0 - 46.0 % 30.0  31.2  31.5   Platelets 150 - 400 K/uL 177  172  175    -Minimum concern for malignancy, likely due to diverticular versus hemorrhoid Continue Protonix -Status post colonoscopy, finding consistent with diverticulosis, with a diverticular bleed, no active bleeding was identified -Monitoring H&H  -Patient reports of passing 1 blood clot this morning   Mild hypokalemia Replete and reevaluate in a.m.   Essential hypertension Continue lisinopril, Toprol-XL   Mixed hyperlipidemia Continue Lipitor   Type 2 diabetes mellitus Continue ISS  and hypoglycemia protocol Metformin on hold    GERD Continue Protonix   CKD 3A Creatinine 1.13 (creatinine is within baseline range) eGFR 53 Renally adjust medications, avoid nephrotoxic agents/dehydration/hypotension   Morbid obesity (BMI 46.10) Diet and lifestyle modification    DVT prophylaxis:SCDs Code Status: Full Family Communication: None at bedside  Disposition Plan: -Will continue to monitor for next 24 hours as patient is passing blood clot -H&H remained stable will discharge in a.m.-     Status is: Inpatient Remains inpatient appropriate because: Need for inpatient procedure and IV medications.   Consultants:  GI  Procedures:  Status post colonoscopy 08/27/2022       Objective: Vitals:   08/27/22 1549 08/27/22 1600 08/27/22 2125 08/28/22 0516  BP:   (!) 162/69 132/64  Pulse: (!) 58  61 63  Resp: 20  18 17   Temp:  (!) 97.5 F (36.4 C) 98.3 F (36.8 C) 98 F (36.7 C)  TempSrc:    Oral  SpO2: 100%  100% 99%  Weight:      Height:        Intake/Output Summary (Last 24 hours) at 08/28/2022 1025 Last data filed at 08/28/2022 0500 Gross per 24 hour  Intake 1240 ml  Output --  Net 1240 ml   Filed Weights   08/25/22 1751  Weight: 112.5 kg        General:  AAO x 3,  cooperative, no distress;   HEENT:  Normocephalic, PERRL, otherwise with in Normal limits   Neuro:  CNII-XII  intact. , normal motor and sensation, reflexes intact   Lungs:   Clear to auscultation BL, Respirations unlabored,  No wheezes / crackles  Cardio:    S1/S2, RRR, No murmure, No Rubs or Gallops   Abdomen:  Soft, non-tender, bowel sounds active all four quadrants, no guarding or peritoneal signs.  Muscular  skeletal:  Limited exam -global generalized weaknesses - in bed, able to move all 4 extremities,   2+ pulses,  symmetric, No pitting edema  Skin:  Dry, warm to touch, negative for any Rashes,  Wounds: Please see nursing documentation          Data Reviewed: I  have personally reviewed following labs and imaging studies  CBC: Recent Labs  Lab 08/25/22 1821 08/25/22 2130 08/26/22 0444 08/27/22 0452 08/28/22 0428  WBC 5.7  --  4.4 4.5 4.3  NEUTROABS 3.0  --   --   --   --   HGB 10.9* 10.2* 10.0* 9.9* 9.4*  HCT 35.1* 32.4* 31.5* 31.2* 30.0*  MCV 88.9  --  88.2 88.1 88.2  PLT 189  --  175 172 177   Basic Metabolic Panel: Recent Labs  Lab 08/25/22 1821 08/26/22 0444 08/27/22 0452  NA 140 139 139  K 3.7 3.4* 4.0  CL 102 105 107  CO2 30 27 26   GLUCOSE 89 102* 111*  BUN 23 19 10   CREATININE 1.13* 0.91 0.87  CALCIUM 8.8* 8.4* 8.3*  MG  --  1.9 1.8  PHOS  --  3.7  --    GFR: Estimated Creatinine Clearance: 71.7 mL/min (by C-G formula based on SCr of 0.87 mg/dL). Liver Function Tests: Recent Labs  Lab 08/26/22 0444  AST 17  ALT 15  ALKPHOS 65  BILITOT 0.6  PROT 6.5  ALBUMIN 3.1*    HbA1C: Recent Labs    08/26/22 0444  HGBA1C 5.9*   CBG: Recent Labs  Lab 08/27/22 1356 08/27/22 1450 08/27/22 1535 08/27/22 2127 08/28/22 0723  GLUCAP 70 105* 105* 110* 106*      Radiology Studies: No results found.      Scheduled Meds:  atorvastatin  10 mg Oral Daily   cholecalciferol  1,000 Units Oral Daily   cyanocobalamin  500 mcg Oral Daily   insulin aspart  0-9 Units Subcutaneous TID WC   lisinopril  10 mg Oral Daily   metoprolol succinate  50 mg Oral Daily   pantoprazole (PROTONIX) IV  40 mg Intravenous Q12H     LOS: 3 days    Time spent: 35 minutes    Theadora Noyes A Kendra Grissett,MD  [facility]  If 7PM-7AM, please contact night-coverage [email or web address] 08/28/2022, 10:25 AM

## 2022-08-29 DIAGNOSIS — K625 Hemorrhage of anus and rectum: Secondary | ICD-10-CM | POA: Diagnosis not present

## 2022-08-29 LAB — CBC
HCT: 31.2 % — ABNORMAL LOW (ref 36.0–46.0)
Hemoglobin: 9.8 g/dL — ABNORMAL LOW (ref 12.0–15.0)
MCH: 28 pg (ref 26.0–34.0)
MCHC: 31.4 g/dL (ref 30.0–36.0)
MCV: 89.1 fL (ref 80.0–100.0)
Platelets: 168 10*3/uL (ref 150–400)
RBC: 3.5 MIL/uL — ABNORMAL LOW (ref 3.87–5.11)
RDW: 13.7 % (ref 11.5–15.5)
WBC: 4.2 10*3/uL (ref 4.0–10.5)
nRBC: 0 % (ref 0.0–0.2)

## 2022-08-29 LAB — GLUCOSE, CAPILLARY: Glucose-Capillary: 125 mg/dL — ABNORMAL HIGH (ref 70–99)

## 2022-08-29 LAB — HEMOGLOBIN AND HEMATOCRIT, BLOOD
HCT: 34.7 % — ABNORMAL LOW (ref 36.0–46.0)
Hemoglobin: 10.9 g/dL — ABNORMAL LOW (ref 12.0–15.0)

## 2022-08-29 NOTE — Care Management Important Message (Signed)
Important Message  Patient Details  Name: Stacey Mcneil MRN: [id number] Date of Birth: 1950-11-18   Medicare Important Message Given:  Yes     Corey Harold 08/29/2022, 9:40 AM

## 2022-08-29 NOTE — Discharge Summary (Signed)
Physician Discharge Summary   Patient: Stacey Mcneil MRN: [id number] DOB: 03/24/1951  Admit date:     08/25/2022  Discharge date: 08/29/22  Discharge Physician: Kendell Bane   PCP: [facility]   Recommendations at discharge:   Follow-up with PCP 1 week Follow-up with gastroenterologist in 1-2 weeks Avoid NSAIDs NSAIDs products  Discharge Diagnoses: Principal Problem:   Rectal bleeding Active Problems:   Morbid obesity (HCC)   GERD (gastroesophageal reflux disease)   Essential hypertension   Mixed hyperlipidemia   Type 2 diabetes mellitus with chronic kidney disease, without long-term current use of insulin (HCC)   Lower gastrointestinal bleed   Stacey Mcneil is a 72 y.o. female with medical history significant of hypertension, hyperlipidemia, type 2 diabetes mellitus, GERD, CKD 3A who presents to the emergency department due to rectal bleed.  Her hemoglobin levels have remained stable and she has been assessed by GI today with plans for colonoscopy in AM.   Assessment & Plan:   Principal Problem:   Rectal bleeding Active Problems:   Morbid obesity (HCC)   GERD (gastroesophageal reflux disease)   Essential hypertension   Mixed hyperlipidemia   Type 2 diabetes mellitus with chronic kidney disease, without long-term current use of insulin (HCC)   GIB (gastrointestinal bleeding)   Assessment and Plan:     Rectal bleeding    Latest Ref Rng & Units 08/29/2022    8:58 AM 08/29/2022    4:47 AM 08/28/2022   11:12 AM  CBC  WBC 4.0 - 10.5 K/uL  4.2    Hemoglobin 12.0 - 15.0 g/dL 40.9  9.8  81.1   Hematocrit 36.0 - 46.0 % 34.7  31.2  33.8   Platelets 150 - 400 K/uL  168       -Minimum concern for malignancy, likely due to diverticular versus hemorrhoid Continue Protonix -Status post colonoscopy, finding consistent with diverticulosis, with a diverticular bleed, no active bleeding was identified    -Patient reports of passing 1 blood  clot this morning     Mild hypokalemia Replete and reevaluate in a.m.   Essential hypertension Continue lisinopril, Toprol-XL   Mixed hyperlipidemia Continue Lipitor   Type 2 diabetes mellitus Resume home medication including metformin, diabetic diet,   GERD Continue home medication omeprazole   CKD 3A Creatinine 1.13 (creatinine is within baseline range) eGFR 53 Renally adjust medications, avoid nephrotoxic agents/dehydration/hypotension   Morbid obesity (BMI 46.10) Diet and lifestyle modification   Consultants: Gastroenterologist Procedures performed: Colonoscopy Disposition: Home Diet recommendation:  Discharge Diet Orders (From admission, onward)     Start     Ordered   08/29/22 0000  Diet - low sodium heart healthy        08/29/22 1013           Cardiac diet DISCHARGE MEDICATION: Allergies as of 08/29/2022   No Known Allergies      Medication List     STOP taking these medications    hydrochlorothiazide 25 MG tablet Commonly known as: HYDRODIURIL       TAKE these medications    acetaminophen 650 MG CR tablet Commonly known as: TYLENOL Take 650 mg by mouth every 8 (eight) hours as needed for pain.   atorvastatin 10 MG tablet Commonly known as: LIPITOR Take 10 mg by mouth daily.   B-12 500 MCG Tabs Take 500 mcg by mouth daily.   cholecalciferol 25 MCG (1000 UNIT) tablet Commonly known as: VITAMIN D3 Take 1,000 Units  by mouth daily.   Fish Oil 1200 MG Caps Take 1,200 mg by mouth daily.   lisinopril 10 MG tablet Commonly known as: ZESTRIL Take 10 mg by mouth daily.   metFORMIN 500 MG tablet Commonly known as: GLUCOPHAGE Take 500 mg by mouth daily.   metoprolol succinate 50 MG 24 hr tablet Commonly known as: TOPROL-XL Take 50 mg by mouth daily.   multivitamin with minerals tablet Take 1 tablet by mouth daily.   omeprazole 40 MG capsule Commonly known as: PRILOSEC Take 40 mg by mouth daily.        Discharge  Exam: Filed Weights   08/25/22 1751  Weight: 112.5 kg        General:  AAO x 3,  cooperative, no distress;   HEENT:  Normocephalic, PERRL, otherwise with in Normal limits   Neuro:  CNII-XII intact. , normal motor and sensation, reflexes intact   Lungs:   Clear to auscultation BL, Respirations unlabored,  No wheezes / crackles  Cardio:    S1/S2, RRR, No murmure, No Rubs or Gallops   Abdomen:  Soft, non-tender, bowel sounds active all four quadrants, no guarding or peritoneal signs.  Muscular  skeletal:  Limited exam -global generalized weaknesses - in bed, able to move all 4 extremities,   2+ pulses,  symmetric, No pitting edema  Skin:  Dry, warm to touch, negative for any Rashes,  Wounds: Please see nursing documentation          Condition at discharge: good  The results of significant diagnostics from this hospitalization (including imaging, microbiology, ancillary and laboratory) are listed below for reference.   Imaging Studies: No results found.  Microbiology: No results found for this or any previous visit.  Labs: CBC: Recent Labs  Lab 08/25/22 1821 08/25/22 2130 08/26/22 0444 08/27/22 0452 08/28/22 0428 08/28/22 1112 08/29/22 0447 08/29/22 0858  WBC 5.7  --  4.4 4.5 4.3  --  4.2  --   NEUTROABS 3.0  --   --   --   --   --   --   --   HGB 10.9*   < > 10.0* 9.9* 9.4* 10.5* 9.8* 10.9*  HCT 35.1*   < > 31.5* 31.2* 30.0* 33.8* 31.2* 34.7*  MCV 88.9  --  88.2 88.1 88.2  --  89.1  --   PLT 189  --  175 172 177  --  168  --    < > = values in this interval not displayed.   Basic Metabolic Panel: Recent Labs  Lab 08/25/22 1821 08/26/22 0444 08/27/22 0452  NA 140 139 139  K 3.7 3.4* 4.0  CL 102 105 107  CO2 30 27 26   GLUCOSE 89 102* 111*  BUN 23 19 10   CREATININE 1.13* 0.91 0.87  CALCIUM 8.8* 8.4* 8.3*  MG  --  1.9 1.8  PHOS  --  3.7  --    Liver Function Tests: Recent Labs  Lab 08/26/22 0444  AST 17  ALT 15  ALKPHOS 65  BILITOT 0.6   PROT 6.5  ALBUMIN 3.1*   CBG: Recent Labs  Lab 08/28/22 0723 08/28/22 1117 08/28/22 1611 08/28/22 2152 08/29/22 0739  GLUCAP 106* 85 96 127* 125*    Discharge time spent: greater than 30 minutes.  Signed: Kendell Bane, MD [facility] 08/29/2022

## 2022-08-29 NOTE — Progress Notes (Signed)
Called to patients room this morning, patient has bloody stool, medium amount in the toilet. Patient is concerned due to already having had a colonoscopy the other day.

## 2022-09-03 ENCOUNTER — Encounter (HOSPITAL_COMMUNITY): Payer: Self-pay | Admitting: Internal Medicine

## 2022-09-17 ENCOUNTER — Ambulatory Visit (INDEPENDENT_AMBULATORY_CARE_PROVIDER_SITE_OTHER): Payer: Medicare PPO | Admitting: Internal Medicine

## 2022-09-17 ENCOUNTER — Encounter: Payer: Self-pay | Admitting: Internal Medicine

## 2022-09-17 ENCOUNTER — Other Ambulatory Visit (INDEPENDENT_AMBULATORY_CARE_PROVIDER_SITE_OTHER): Payer: Self-pay

## 2022-09-17 ENCOUNTER — Telehealth (INDEPENDENT_AMBULATORY_CARE_PROVIDER_SITE_OTHER): Payer: Self-pay

## 2022-09-17 VITALS — BP 134/80 | HR 80 | Temp 97.1°F | Ht 61.5 in | Wt 250.2 lb

## 2022-09-17 DIAGNOSIS — R1319 Other dysphagia: Secondary | ICD-10-CM | POA: Diagnosis not present

## 2022-09-17 DIAGNOSIS — D649 Anemia, unspecified: Secondary | ICD-10-CM | POA: Diagnosis not present

## 2022-09-17 DIAGNOSIS — K5731 Diverticulosis of large intestine without perforation or abscess with bleeding: Secondary | ICD-10-CM

## 2022-09-17 DIAGNOSIS — K219 Gastro-esophageal reflux disease without esophagitis: Secondary | ICD-10-CM | POA: Diagnosis not present

## 2022-09-17 DIAGNOSIS — K625 Hemorrhage of anus and rectum: Secondary | ICD-10-CM

## 2022-09-17 LAB — CBC WITH DIFFERENTIAL/PLATELET
Absolute Monocytes: 365 cells/uL (ref 200–950)
Basophils Absolute: 30 cells/uL (ref 0–200)
Basophils Relative: 0.6 %
Eosinophils Absolute: 140 cells/uL (ref 15–500)
Eosinophils Relative: 2.8 %
HCT: 31.8 % — ABNORMAL LOW (ref 35.0–45.0)
Hemoglobin: 10 g/dL — ABNORMAL LOW (ref 11.7–15.5)
Lymphs Abs: 1545 cells/uL (ref 850–3900)
MCH: 27.5 pg (ref 27.0–33.0)
MCHC: 31.4 g/dL — ABNORMAL LOW (ref 32.0–36.0)
MCV: 87.4 fL (ref 80.0–100.0)
MPV: 10.6 fL (ref 7.5–12.5)
Monocytes Relative: 7.3 %
Neutro Abs: 2920 cells/uL (ref 1500–7800)
Neutrophils Relative %: 58.4 %
Platelets: 245 10*3/uL (ref 140–400)
RBC: 3.64 10*6/uL — ABNORMAL LOW (ref 3.80–5.10)
RDW: 13 % (ref 11.0–15.0)
Total Lymphocyte: 30.9 %
WBC: 5 10*3/uL (ref 3.8–10.8)

## 2022-09-17 NOTE — Patient Instructions (Signed)
I am happy to hear that you are doing better since your hospitalization.  I will give you more information in regards to diverticular disease (see below).  Continue on omeprazole for your chronic reflux.  Follow-up in GI office in 3 to 4 months.  It is always a pleasure seeing you.  Dr. Marletta Lor   DIVERTICULAR DISEASE OVERVIEW  A diverticulum is a pouch-like structure that can form through points of weakness in the muscular wall of the colon (ie, at points where blood vessels pass through the wall).  Diverticulosis affects males and females equally. The risk of diverticular disease increases with age. It occurs throughout the world but is seen more commonly in developed countries.  WHAT IS DIVERTICULAR DISEASE?  Diverticulosis -- Diverticulosis merely describes the presence of diverticula. Diverticulosis is often found during a test done for other reasons, such as flexible sigmoidoscopy, colonoscopy, or barium enema. Most people with diverticulosis have no symptoms and will remain symptom free for the rest of their lives. (See 'Diverticular disease prognosis' below.)  A person with diverticulosis may have diverticulitis, or diverticular bleeding.  Diverticulitis -- Inflammation of a diverticulum (diverticulitis) occurs when there is thinning and breakdown of the diverticular wall. This may be caused by increased pressure within the colon or by hardened particles of stool, which can become lodged within the diverticulum.  The symptoms of diverticulitis depend upon the degree of inflammation present. The most common symptom is pain in the left lower abdomen. Other symptoms can include nausea and vomiting, constipation, diarrhea, and urinary symptoms such as pain or burning when urinating or the frequent need to urinate.  Diverticulitis is divided into simple and complicated forms.  ?Simple diverticulitis, which accounts for 75 percent of cases, is not associated with complications and  typically responds to medical treatment without surgery.  ?Complicated diverticulitis occurs in 25 percent of cases and usually requires surgery. Complications associated with diverticulitis can include the following:  Abscess - a localized collection of pus  Fistula - an abnormal tract between two areas that are not normally connected (eg, bowel and bladder)  Obstruction - a blockage of the colon  Peritonitis - infection involving the space around the abdominal organ  Sepsis - overwhelming body-wide infection that can lead to failure of multiple organs  Diverticular bleeding -- Diverticular bleeding occurs when a small artery located within a diverticulum is eroded and bleeds into the colon.  Diverticular bleeding usually causes painless bleeding from the rectum. In approximately 50 percent of cases, the person will see maroon or bright red blood with bowel movements.  Is bleeding with a bowel movement normal? -- It is not normal to see blood in a bowel movement; this can be a sign of several conditions, most of which are not serious (eg, hemorrhoids) but some of which are serious and require immediate treatment. Anyone who sees blood after a bowel movement should consult with their health care provider to determine if further testing or evaluation is needed. (See "Patient education: Blood in the stool (rectal bleeding) in adults (Beyond the Basics)".)  DIVERTICULOSIS AND DIVERTICULITIS DIAGNOSIS  Diverticulosis is often found during tests performed for other reasons.  ?Barium enema - This is an x-ray study that uses barium in an enema to view the outline of the lower intestinal tract. This is an older test and has been largely replaced by computed tomography (CT) scan.  ?Flexible sigmoidoscopy - This is an examination of the inside of the sigmoid colon with a thin, flexible tube  that contains a camera. (See "Patient education: Flexible sigmoidoscopy (Beyond the Basics)".)  ?Colonoscopy  - This is an examination of the inside of the entire colon. (See "Patient education: Colonoscopy (Beyond the Basics)".)  ?CT scan - A CT scan is used often to diagnose diverticulitis and its complications. If diverticulitis (not just diverticulosis) is suspected, the above three tests should not be used because of the risk of perforation.  TREATMENT  Diverticulosis -- People with diverticulosis who do not have symptoms do not require treatment. However, most clinicians recommend increasing fiber in the diet, which can help to bulk the stools and possibly prevent the development of new diverticula, diverticulitis, or diverticular bleeding. Fiber is not proven to prevent these conditions in all patients but may help to control recurrent episodes in some.  Increase fiber -- Fruits and vegetables are a good source of fiber (table 1). The fiber content of packaged foods can be calculated by reading the nutrition label (figure 1). (See "Patient education: High-fiber diet (Beyond the Basics)".)  Seeds and nuts -- Patients with diverticular disease have historically been advised to avoid whole pieces of fiber (such as seeds, corn, and nuts) because of concern that these foods could cause an episode of diverticulitis. However, this belief is completely unproven. We do not suggest that patients with diverticulosis avoid seeds, corn, or nuts.  Diverticulitis -- Treatment of diverticulitis depends upon how severe your symptoms are.  Home treatment -- If your disease is mild and you are otherwise healthy, you might be treated at home. This usually involves a liquid diet and pain-relieving medication. However, if you develop one or more of the following signs or symptoms, you should seek immediate medical attention:  ?Temperature >100.86F (38C)  ?Worsening or severe abdominal pain  ?Inability to tolerate fluids  Hospital treatment -- If you have severe disease, you are not in good health, or your symptoms  do not get better in two to three days, you might need to be hospitalized for treatment. Initially in the hospital, you will not eat or drink anything; you will receive fluids and antibiotics given through a vein (by IV).  If you develop an abscess of the colon, you may require drainage of the abscess (usually performed by placing a drainage tube across the abdominal wall under radiologic guidance) or by surgically opening the affected area.  Surgery -- If you develop a generalized infection in the abdomen (peritonitis), you will usually require an emergency operation. A two-part operation may be necessary in some cases.  ?The first operation involves removal of the diseased colon and creation of a colostomy. A colostomy is an opening between the colon and the skin, where a bag is attached to collect waste from the intestine. The lower end of the colon is temporarily sewed closed to allow it to heal (figure 2).  ?Approximately three to six months later, a second operation is performed to reconnect the two parts of the colon and close the opening in the skin. You are then able to empty your bowels through the rectum. Sometimes patients require up to a year to recover from the first operation, depending on how sick they were.  In non-emergency situations, the diseased area of the colon can be removed and the two ends of the colon can be reconnected in one operation, without the need for a colostomy.  Surgery versus medical therapy -- An operation to remove the diseased area of the colon may be necessary if you do not improve with  medical therapy. After an episode of uncomplicated diverticulitis, elective surgery is generally not required as the risk of another attack or requiring emergency surgery is low. However, patients with persistent symptoms attributable to diverticulitis, a history of complicated diverticulitis, or a compromised immune system should be evaluated for possible surgery to prevent  another attack. In such patients, another attack has been associated with a higher risk of complications or death. Of course, the decision will also depend in part upon your other medical conditions and ability to undergo surgery.  In many cases, an elective operation can be performed laparoscopically, using small incisions, rather than the typical vertical (up and down) abdominal incision. Laparoscopic surgery usually allows you to recover more quickly and shortens the hospital stay.  After diverticulitis resolves -- After an episode of diverticulitis resolves, if you have not had a recent colonoscopy, the entire length of the colon should be evaluated to determine the extent of disease and to rule out the presence of abnormal lesions such as polyps or cancer. Recommended tests include one of the following: colonoscopy, barium enema and sigmoidoscopy, or CT colonography to rule out concomitant disease. (See "Patient education: Screening for colorectal cancer (Beyond the Basics)".)  If you have had diverticulitis, it's a good idea to eat a diet high in fiber after your symptoms have gotten better. (See 'Increase fiber' above.)  Diverticular bleeding -- Most cases of diverticular bleeding resolve on their own. However, some people will need further testing or treatment to stop bleeding, which may include a colonoscopy, angiography (a treatment that blocks off the bleeding artery), bleeding scan, or surgery.  DIVERTICULAR DISEASE PROGNOSIS  Diverticulosis -- Over time, diverticulosis may cause no problems or it may cause episodes of bleeding and/or diverticulitis. Approximately 15 to 25 percent of people with diverticulosis will develop diverticulitis, while 5 to 15 percent will develop diverticular bleeding.  Diverticulitis -- Approximately 85 percent of people with uncomplicated diverticulitis will respond to medical treatment, while approximately 15 percent of patients will need an operation. After  successful treatment for a first attack of diverticulitis, one-third of patients will remain asymptomatic, one-third will have episodic cramps without diverticulitis, and one-third will go on to have a second attack of diverticulitis.  The prognosis tends to remain similar following a second attack of diverticulitis. Only 10 percent of people remain symptom-free after a second attack. Subsequent attacks tend to be of similar severity, not increasing in severity as previously believed.

## 2022-09-17 NOTE — Telephone Encounter (Signed)
Thank you :)

## 2022-09-17 NOTE — Progress Notes (Signed)
Referring Provider: [facility] Primary Care Physician:  [facility] Primary GI:  Dr. Marletta Lor  Chief Complaint  Patient presents with   Follow-up    Patient here today for a follow up. Patient says she had a recent hospitalization on 08/25/2022 due to rectal bleeding. She says she did see some blood in her stool as recent as Monday. Patient denies any current shortness of breath,dizziness, chest pain or fatigue. Patient last hgb were 9.8 and hct 31.2, same day hgb rechecked at hgb 10.9 and hct 34.7. She is taking fe once per day. Patient does see a nephrologist and has CKD 2.    HPI:   Stacey Mcneil is a 72 y.o. female who presents to clinic today for hospital follow-up visit.  Recent admission to [facility] 08/25/2022 for lower GI bleed and acute blood loss anemia.  Colonoscopy revealed blood and clots in her sigmoid colon.  Diverticulosis noted in her sigmoid and descending colons.  No active bleeding.  Symptoms felt to be due to diverticular bleeding.  Treated conservatively and discharged home.  Today states she is doing well.  Has not had no further bleeding since her hospitalization.    GERD well-controlled on omeprazole daily.  History of esophageal dysphagia which is resolved status post dilation in 2022.  EGD August 2022: - Multiple gastric polyps.  Hyperplastic gastric polyps. - Gastritis. Biopsied.  Slight chronic inflammation.  No H. pylori. - Normal duodenal bulb, first portion of the duodenum and second portion of the duodenum.  Past Medical History:  Diagnosis Date   Anemia    Chronic kidney disease    Diabetes mellitus without complication (HCC)    Hypertension     Past Surgical History:  Procedure Laterality Date   BALLOON DILATION N/A 11/12/2020   Procedure: BALLOON DILATION;  Surgeon: Lanelle Bal, DO;  [facility]: [facility];  Service: Endoscopy;  Laterality: N/A;   BIOPSY  11/12/2020   Procedure:  BIOPSY;  Surgeon: Lanelle Bal, DO;  Location: [facility];  Service: Endoscopy;;   COLONOSCOPY  01/2016   [facility]: Perianal skin tag found in the perianal area, diverticulosis, nonbleeding internal hemorrhoids.  Quality of prep was good.  Next colonoscopy 10 years   COLONOSCOPY WITH PROPOFOL N/A 08/27/2022   Procedure: COLONOSCOPY WITH PROPOFOL;  Surgeon: Corbin Ade, MD;  Location: [facility];  Service: Endoscopy;  Laterality: N/A;   ESOPHAGOGASTRODUODENOSCOPY N/A 01/20/2017   Fields: Mild Schatzki ring status post dilation, gastritis (benign pathology with no H. 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)   ESOPHAGOGASTRODUODENOSCOPY (EGD) WITH PROPOFOL N/A 11/12/2020   Procedure: ESOPHAGOGASTRODUODENOSCOPY (EGD) WITH PROPOFOL;  Surgeon: Lanelle Bal, DO;  [facility];  Service: Endoscopy;  Laterality: N/A;  9:30am   SAVORY DILATION N/A 01/20/2017   Procedure: SAVORY DILATION;  Surgeon: West Bali, MD;  Location: [facility];  Service: Endoscopy;  Laterality: N/A;    Current Outpatient Medications  Medication Sig Dispense Refill   acetaminophen (TYLENOL) 650 MG CR tablet Take 650 mg by mouth every 8 (eight) hours as needed for pain.     atorvastatin (LIPITOR) 10 MG tablet Take 10 mg by mouth daily.     cholecalciferol (VITAMIN D3) 25 MCG (1000 UNIT) tablet Take 1,000 Units by mouth daily.     Cyanocobalamin (B-12) 500 MCG TABS Take 500 mcg by mouth daily.     lisinopril (  PRINIVIL,ZESTRIL) 10 MG tablet Take 10 mg by mouth daily.   5   metFORMIN (GLUCOPHAGE) 500 MG tablet Take 500 mg by mouth daily.     metoprolol succinate (TOPROL-XL) 50 MG 24 hr tablet Take 50 mg by mouth daily.  0   Multiple Vitamins-Minerals (MULTIVITAMIN WITH MINERALS) tablet Take 1 tablet by mouth daily.     Omega-3 Fatty Acids (FISH OIL) 1200 MG CAPS Take 1,200 mg by mouth daily.      omeprazole (PRILOSEC) 40 MG capsule Take 40 mg by mouth daily.  5    No current facility-administered medications for this visit.    Allergies as of 09/17/2022   (No Known Allergies)    Family History  Problem Relation Age of Onset   Congestive Heart Failure Father    Stroke Mother    Leukemia Brother    Heart attack Sister    Hypertension Sister    Diabetes Sister    Other Brother        double pneumonia; paralyzed   Kidney disease Brother    Diabetes Brother    Hypertension Brother    Other Brother        on dialysis   Hypertension Sister    Diabetes Sister    Hypertension Sister    Diabetes Sister    Stroke Sister    Hypertension Daughter    Hypertension Daughter    Colon cancer Neg Hx    Colon polyps Neg Hx     Social History   Socioeconomic History   Marital status: Single    Spouse name: Not on file   Number of children: Not on file   Years of education: Not on file   Highest education level: Not on file  Occupational History   Not on file  Tobacco Use   Smoking status: Never    Passive exposure: Never   Smokeless tobacco: Never  Vaping Use   Vaping Use: Never used  Substance and Sexual Activity   Alcohol use: No   Drug use: No   Sexual activity: Yes    Birth control/protection: Post-menopausal  Other Topics Concern   Not on file  Social History Narrative   ONE PARTNER.    Social Determinants of Health   Financial Resource Strain: Low Risk  (06/15/2020)   Overall Financial Resource Strain (CARDIA)    Difficulty of Paying Living Expenses: Not hard at all  Food Insecurity: No Food Insecurity (06/15/2020)   Hunger Vital Sign    Worried About Running Out of Food in the Last Year: Never true    Ran Out of Food in the Last Year: Never true  Transportation Needs: No Transportation Needs (06/15/2020)   PRAPARE - Administrator, Civil Service (Medical): No    Lack of Transportation (Non-Medical): No  Physical Activity: Insufficiently Active (06/15/2020)   Exercise Vital Sign    Days of Exercise per  Week: 1 day    Minutes of Exercise per Session: 10 min  Stress: No Stress Concern Present (06/15/2020)   [employer] of Occupational Health - Occupational Stress Questionnaire    Feeling of Stress : Not at all  Social Connections: Moderately Integrated (06/15/2020)   Social Connection and Isolation Panel [NHANES]    Frequency of Communication with Friends and Family: More than three times a week    Frequency of Social Gatherings with Friends and Family: Twice a week    Attends Religious Services: More than 4 times per year  Active Member of Clubs or Organizations: Yes    Attends Banker Meetings: More than 4 times per year    Marital Status: Widowed    Subjective: Review of Systems  Constitutional:  Negative for chills and fever.  HENT:  Negative for congestion and hearing loss.   Eyes:  Negative for blurred vision and double vision.  Respiratory:  Negative for cough and shortness of breath.   Cardiovascular:  Negative for chest pain and palpitations.  Gastrointestinal:  Negative for abdominal pain, blood in stool, constipation, diarrhea, heartburn, melena and vomiting.  Genitourinary:  Negative for dysuria and urgency.  Musculoskeletal:  Negative for joint pain and myalgias.  Skin:  Negative for itching and rash.  Neurological:  Negative for dizziness and headaches.  Psychiatric/Behavioral:  Negative for depression. The patient is not nervous/anxious.      Objective: BP 134/80 (BP Location: Left Arm, Patient Position: Sitting, Cuff Size: Large)   Pulse 80   Temp (!) 97.1 F (36.2 C) (Temporal)   Ht 5' 1.5" (1.562 m)   Wt 250 lb 3.2 oz (113.5 kg)   BMI 46.51 kg/m  Physical Exam Constitutional:      Appearance: Normal appearance. She is obese.  HENT:     Head: Normocephalic and atraumatic.  Eyes:     Extraocular Movements: Extraocular movements intact.     Conjunctiva/sclera: Conjunctivae normal.  Cardiovascular:     Rate and Rhythm: Normal rate and  regular rhythm.     Heart sounds: Murmur heard.  Pulmonary:     Effort: Pulmonary effort is normal.     Breath sounds: Normal breath sounds.  Abdominal:     General: Bowel sounds are normal.     Palpations: Abdomen is soft.  Musculoskeletal:        General: No swelling. Normal range of motion.     Cervical back: Normal range of motion and neck supple.  Skin:    General: Skin is warm and dry.     Coloration: Skin is not jaundiced.  Neurological:     General: No focal deficit present.     Mental Status: She is alert and oriented to person, place, and time.  Psychiatric:        Mood and Affect: Mood normal.        Behavior: Behavior normal.      Assessment: *Chronic GERD-well controlled on Omeprazole daily, will continue *Esophageal dysphagia-improved *Diverticulosis with recent lower GI bleed  Plan: Patient with recent hospitalization with what appears to be diverticular bleed.  Discussed diverticulosis in depth with her today.  Will print off more information.  Recommend she increase fiber in her diet or add over-the-counter fiber supplement.  Check CBC today.  Will call with results.  GERD well-controlled on omeprazole.  Will continue.  Esophageal dysphagia at baseline.  Improved.  Follow-up in 3 to 4 months.  09/17/2022 11:10 AM   Disclaimer: This note was dictated with voice recognition software. Similar sounding words can inadvertently be transcribed and may not be corrected upon review.

## 2022-09-17 NOTE — Telephone Encounter (Signed)
Patient picked up the order and has proceeded to the lab.  CBC to be ordered today per Dr. Marletta Lor. I have called the patient and she is aware she will need to come back here to pick up the orders for [facility] and take it next door to have her cbc drawn as per Dr.Carver patient has not had her blood counts checked since her hospitalization.

## 2022-10-07 DIAGNOSIS — K5730 Diverticulosis of large intestine without perforation or abscess without bleeding: Secondary | ICD-10-CM | POA: Diagnosis not present

## 2022-10-07 DIAGNOSIS — E785 Hyperlipidemia, unspecified: Secondary | ICD-10-CM | POA: Diagnosis not present

## 2022-10-07 DIAGNOSIS — K219 Gastro-esophageal reflux disease without esophagitis: Secondary | ICD-10-CM | POA: Diagnosis not present

## 2022-10-07 DIAGNOSIS — N1832 Chronic kidney disease, stage 3b: Secondary | ICD-10-CM | POA: Diagnosis not present

## 2022-10-07 DIAGNOSIS — E119 Type 2 diabetes mellitus without complications: Secondary | ICD-10-CM | POA: Diagnosis not present

## 2022-10-07 DIAGNOSIS — E669 Obesity, unspecified: Secondary | ICD-10-CM | POA: Diagnosis not present

## 2022-10-07 DIAGNOSIS — Z7689 Persons encountering health services in other specified circumstances: Secondary | ICD-10-CM | POA: Diagnosis not present

## 2022-10-07 DIAGNOSIS — I129 Hypertensive chronic kidney disease with stage 1 through stage 4 chronic kidney disease, or unspecified chronic kidney disease: Secondary | ICD-10-CM | POA: Diagnosis not present

## 2022-10-07 DIAGNOSIS — R809 Proteinuria, unspecified: Secondary | ICD-10-CM | POA: Diagnosis not present

## 2022-10-07 DIAGNOSIS — Z7182 Exercise counseling: Secondary | ICD-10-CM | POA: Diagnosis not present

## 2022-10-07 DIAGNOSIS — I10 Essential (primary) hypertension: Secondary | ICD-10-CM | POA: Diagnosis not present

## 2022-10-07 DIAGNOSIS — D631 Anemia in chronic kidney disease: Secondary | ICD-10-CM | POA: Diagnosis not present

## 2022-10-13 DIAGNOSIS — N1831 Chronic kidney disease, stage 3a: Secondary | ICD-10-CM | POA: Diagnosis not present

## 2022-10-13 DIAGNOSIS — E1129 Type 2 diabetes mellitus with other diabetic kidney complication: Secondary | ICD-10-CM | POA: Diagnosis not present

## 2022-10-13 DIAGNOSIS — R809 Proteinuria, unspecified: Secondary | ICD-10-CM | POA: Diagnosis not present

## 2022-10-13 DIAGNOSIS — E1122 Type 2 diabetes mellitus with diabetic chronic kidney disease: Secondary | ICD-10-CM | POA: Diagnosis not present

## 2022-10-24 ENCOUNTER — Encounter: Payer: Self-pay | Admitting: Cardiology

## 2022-10-24 ENCOUNTER — Ambulatory Visit: Payer: Medicare PPO | Attending: Cardiology | Admitting: Cardiology

## 2022-10-24 VITALS — BP 120/70 | HR 73 | Ht 61.5 in | Wt 244.2 lb

## 2022-10-24 DIAGNOSIS — Z136 Encounter for screening for cardiovascular disorders: Secondary | ICD-10-CM

## 2022-10-24 DIAGNOSIS — R9431 Abnormal electrocardiogram [ECG] [EKG]: Secondary | ICD-10-CM | POA: Diagnosis not present

## 2022-10-24 DIAGNOSIS — I6523 Occlusion and stenosis of bilateral carotid arteries: Secondary | ICD-10-CM

## 2022-10-24 DIAGNOSIS — I350 Nonrheumatic aortic (valve) stenosis: Secondary | ICD-10-CM | POA: Diagnosis not present

## 2022-10-24 NOTE — Progress Notes (Signed)
Clinical Summary Ms. Backstrom is a 72 y.o.female seen for the following medical problems   1.Aortic stenosis - has been told about heart murmur over the last several - does water aerobics 3-4 times a week, tolerates well - occasional LE edema.     12/2021 echo: LVEF 70-75%, no WMAs, grade II dd, moderate AS mean grad 23 AVA VTI 1.07 DI 0.42 - some recent DOE, for example walking at the grocery store. No exertional chest pains, no syncope.   3. Carotid stenosis -12/2021 [place] bilateral 1-39% disease  4, GI bleed - admitted 08/2022 with rectal bleeding - colonscopy with deverticular bleed   Past Medical History:  Diagnosis Date   Anemia    Chronic kidney disease    Diabetes mellitus without complication (HCC)    Hypertension      No Known Allergies   Current Outpatient Medications  Medication Sig Dispense Refill   acetaminophen (TYLENOL) 650 MG CR tablet Take 650 mg by mouth every 8 (eight) hours as needed for pain.     atorvastatin (LIPITOR) 10 MG tablet Take 10 mg by mouth daily.     cholecalciferol (VITAMIN D3) 25 MCG (1000 UNIT) tablet Take 1,000 Units by mouth daily.     Cyanocobalamin (B-12) 500 MCG TABS Take 500 mcg by mouth daily.     lisinopril (PRINIVIL,ZESTRIL) 10 MG tablet Take 10 mg by mouth daily.   5   metFORMIN (GLUCOPHAGE) 500 MG tablet Take 500 mg by mouth daily.     metoprolol succinate (TOPROL-XL) 50 MG 24 hr tablet Take 50 mg by mouth daily.  0   Multiple Vitamins-Minerals (MULTIVITAMIN WITH MINERALS) tablet Take 1 tablet by mouth daily.     Omega-3 Fatty Acids (FISH OIL) 1200 MG CAPS Take 1,200 mg by mouth daily.      omeprazole (PRILOSEC) 40 MG capsule Take 40 mg by mouth daily.  5   No current facility-administered medications for this visit.     Past Surgical History:  Procedure Laterality Date   BALLOON DILATION N/A 11/12/2020   Procedure: BALLOON DILATION;  Surgeon: Lanelle Bal, DO;  [facility];  Service: Endoscopy;   Laterality: N/A;   BIOPSY  11/12/2020   Procedure: BIOPSY;  Surgeon: Lanelle Bal, DO;  [facility]: [facility];  Service: Endoscopy;;   COLONOSCOPY  01/2016   [facility] healthcare: Perianal skin tag found in the perianal area, diverticulosis, nonbleeding internal hemorrhoids.  Quality of prep was good.  Next colonoscopy 10 years   COLONOSCOPY WITH PROPOFOL N/A 08/27/2022   Procedure: COLONOSCOPY WITH PROPOFOL;  Surgeon: Corbin Ade, MD;  Location: [facility];  Service: Endoscopy;  Laterality: N/A;   ESOPHAGOGASTRODUODENOSCOPY N/A 01/20/2017   Fields: Mild Schatzki ring status post dilation, gastritis (benign pathology with no H. 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)   ESOPHAGOGASTRODUODENOSCOPY (EGD) WITH PROPOFOL N/A 11/12/2020   Procedure: ESOPHAGOGASTRODUODENOSCOPY (EGD) WITH PROPOFOL;  Surgeon: Lanelle Bal, DO;  [facility];  Service: Endoscopy;  Laterality: N/A;  9:30am   SAVORY DILATION N/A 01/20/2017   Procedure: SAVORY DILATION;  Surgeon: West Bali, MD;  Location: [facility];  Service: Endoscopy;  Laterality: N/A;     No Known Allergies    Family History  Problem Relation Age of Onset   Congestive Heart Failure Father    Stroke Mother    Leukemia Brother    Heart attack Sister    Hypertension  Sister    Diabetes Sister    Other Brother        double pneumonia; paralyzed   Kidney disease Brother    Diabetes Brother    Hypertension Brother    Other Brother        on dialysis   Hypertension Sister    Diabetes Sister    Hypertension Sister    Diabetes Sister    Stroke Sister    Hypertension Daughter    Hypertension Daughter    Colon cancer Neg Hx    Colon polyps Neg Hx      Social History Ms. Burd reports that she has never smoked. She has never been exposed to tobacco smoke. She has never used smokeless tobacco. Ms. Vasseur reports no history of alcohol use.   Review of Systems CONSTITUTIONAL: No  weight loss, fever, chills, weakness or fatigue.  HEENT: Eyes: No visual loss, blurred vision, double vision or yellow sclerae.No hearing loss, sneezing, congestion, runny nose or sore throat.  SKIN: No rash or itching.  CARDIOVASCULAR: per hpi RESPIRATORY: per hpi GASTROINTESTINAL: No anorexia, nausea, vomiting or diarrhea. No abdominal pain or blood.  GENITOURINARY: No burning on urination, no polyuria NEUROLOGICAL: No headache, dizziness, syncope, paralysis, ataxia, numbness or tingling in the extremities. No change in bowel or bladder control.  MUSCULOSKELETAL: No muscle, back pain, joint pain or stiffness.  LYMPHATICS: No enlarged nodes. No history of splenectomy.  PSYCHIATRIC: No history of depression or anxiety.  ENDOCRINOLOGIC: No reports of sweating, cold or heat intolerance. No polyuria or polydipsia.  Marland Kitchen   Physical Examination Today's Vitals   10/24/22 0911  BP: 120/70  Pulse: 73  SpO2: 100%  Weight: 244 lb 3.2 oz (110.8 kg)  Height: 5' 1.5" (1.562 m)   Body mass index is 45.39 kg/m.  Gen: resting comfortably, no acute distress HEENT: no scleral icterus, pupils equal round and reactive, no palptable cervical adenopathy,  CV: RRR, 3/6 systolic murmur rusb, no jvd Resp: Clear to auscultation bilaterally GI: abdomen is soft, non-tender, non-distended, normal bowel sounds, no hepatosplenomegaly MSK: extremities are warm, no edema.  Skin: warm, no rash Neuro:  no focal deficits Psych: appropriate affect   Diagnostic Studies  12/2021 echo 1. Left ventricular ejection fraction, by estimation, is 70 to 75%. The  left ventricle has hyperdynamic function. The left ventricle has no  regional wall motion abnormalities. Left ventricular diastolic parameters  are consistent with Grade II diastolic  dysfunction (pseudonormalization). Elevated left ventricular end-diastolic  pressure. The average left ventricular global longitudinal strain is -18.7  %. The global longitudinal  strain is normal.   2. Right ventricular systolic function is normal. The right ventricular  size is normal. There is normal pulmonary artery systolic pressure. The  estimated right ventricular systolic pressure is 24.3 mmHg.   3. Left atrial size was mildly dilated.   4. The mitral valve is abnormal. Mild mitral valve regurgitation.   5. The aortic valve is tricuspid. There is moderate calcification of the  aortic valve. Aortic valve regurgitation is not visualized. Moderate  aortic valve stenosis. Aortic valve mean gradient measures 23.0 mmHg.  Aortic valve Vmax measures 3.29 m/s.  Dimentionless index 0.42.   6. The inferior vena cava is normal in size with greater than 50%  respiratory variability, suggesting right atrial pressure of 3 mmHg.    12/2021 carotid US Summary:  Right Carotid: Velocities in the right ICA are consistent with a 1-39%  stenosis.   Left Carotid: Velocities in the  left ICA are consistent with a 1-39%  stenosis.   Vertebrals: Bilateral vertebral arteries demonstrate antegrade flow.  Subclavians: Normal flow hemodynamics were seen in bilateral subclavian               arteries.     Assessment and Plan  1.Aortic stenosis - moderate AS from echo 12/2021 - with some recent progressing DOE we will repeat echo   2. Carotid stenosis - mild by [place], would repeat next year     F/u 6 months    Antoine Poche, M.D.

## 2022-10-24 NOTE — Patient Instructions (Signed)
Medication Instructions:  Continue all current medications.  Labwork: none  Testing/Procedures: Your physician has requested that you have an echocardiogram. Echocardiography is a painless test that uses sound waves to create images of your heart. It provides your doctor with information about the size and shape of your heart and how well your heart's chambers and valves are working. This procedure takes approximately one hour. There are no restrictions for this procedure. Please do NOT wear cologne, perfume, aftershave, or lotions (deodorant is allowed). Please arrive 15 minutes prior to your appointment time. Office will contact with results via phone, letter or mychart.     Follow-Up: 6 months   Any Other Special Instructions Will Be Listed Below (If Applicable).   If you need a refill on your cardiac medications before your next appointment, please call your pharmacy.  

## 2022-11-05 ENCOUNTER — Ambulatory Visit: Payer: Medicare PPO | Attending: Cardiology

## 2022-11-05 DIAGNOSIS — I350 Nonrheumatic aortic (valve) stenosis: Secondary | ICD-10-CM

## 2022-11-25 ENCOUNTER — Telehealth: Payer: Self-pay | Admitting: *Deleted

## 2022-11-25 NOTE — Telephone Encounter (Signed)
-----   Message from Dina Rich sent at 11/19/2022  2:54 PM EDT ----- Aortic stenosis (stiffness) remains moderate and just something to continue to monitor at this time  Dominga Ferry MD

## 2022-11-25 NOTE — Telephone Encounter (Signed)
Lesle Chris, LPN 2/95/2841  3:24 PM EDT Back to Top    Notified, copy to pcp.

## 2022-11-26 DIAGNOSIS — J329 Chronic sinusitis, unspecified: Secondary | ICD-10-CM | POA: Diagnosis not present

## 2022-11-26 DIAGNOSIS — H6590 Unspecified nonsuppurative otitis media, unspecified ear: Secondary | ICD-10-CM | POA: Diagnosis not present

## 2023-01-12 ENCOUNTER — Encounter: Payer: Self-pay | Admitting: Gastroenterology

## 2023-01-12 ENCOUNTER — Ambulatory Visit: Payer: Medicare PPO | Admitting: Gastroenterology

## 2023-01-12 VITALS — BP 132/74 | HR 73 | Temp 98.3°F | Ht 61.5 in | Wt 246.6 lb

## 2023-01-12 DIAGNOSIS — R1013 Epigastric pain: Secondary | ICD-10-CM | POA: Diagnosis not present

## 2023-01-12 DIAGNOSIS — K219 Gastro-esophageal reflux disease without esophagitis: Secondary | ICD-10-CM | POA: Diagnosis not present

## 2023-01-12 MED ORDER — OMEPRAZOLE 40 MG PO CPDR: 40.0000 mg | DELAYED_RELEASE_CAPSULE | Freq: Every day | ORAL | 3 refills | Status: DC

## 2023-01-12 NOTE — Patient Instructions (Addendum)
Resume omeprazole 40mg  daily before breakfast. RX sent to your mail order pharmacy. Call if you have persistent abdominal burning after getting back on omeprazole. Return office visit in one year or sooner if needed.

## 2023-01-12 NOTE — Progress Notes (Signed)
GI Office Note    Referring Provider: [facility] Primary Care Physician:  [facility]  Primary Gastroenterologist: Hennie Duos. Marletta Lor, DO   Chief Complaint   Chief Complaint  Patient presents with   Follow-up    States she has burning at times in her stomach.     History of Present Illness   Stacey Mcneil is a 72 y.o. female presenting today for follow up. Last seen 09/2022. At that time noted recent hospitalization for GI bleeding suspected diverticular. She was noting improved dysphagia at baseline and chronic controlled GERD.  Today: Doing okay but has noted some burning in the stomach since being out of omeprazole for 2 weeks. Was doing fine before that. No heartburn. BMs regular. No melena, brbpr. Stools soft. Sometimes if she has heartburn at night she takes a sip of red wine which seems to help. No issues with swallowing lately.     Colonoscopy 08/2022: -diverticulosis in sigmoid and descending colon  -old blood and clot throughout rectum, desc and sigmoid segments but no active bleeding lesion found. Suspected bleeding secondary to diverticula  EGD August 2022: - Multiple gastric polyps.  Hyperplastic gastric polyps. - Gastritis. Biopsied.  Slight chronic inflammation.  No H. pylori. - Normal duodenal bulb, first portion of the duodenum and second portion of the duodenum. -esophagus normal, s/p empiric dilation  Medications   Current Outpatient Medications  Medication Sig Dispense Refill   acetaminophen (TYLENOL) 650 MG CR tablet Take 650 mg by mouth every 8 (eight) hours as needed for pain.     atorvastatin (LIPITOR) 10 MG tablet Take 10 mg by mouth daily.     cetirizine (ZYRTEC) 10 MG tablet Take 10 mg by mouth daily.     cholecalciferol (VITAMIN D3) 25 MCG (1000 UNIT) tablet Take 1,000 Units by mouth daily.     Cyanocobalamin (B-12) 500 MCG TABS Take 500 mcg by mouth daily.     lisinopril (PRINIVIL,ZESTRIL) 10 MG  tablet Take 10 mg by mouth daily.   5   metFORMIN (GLUCOPHAGE) 500 MG tablet Take 500 mg by mouth daily.     metoprolol succinate (TOPROL-XL) 50 MG 24 hr tablet Take 50 mg by mouth daily.  0   Multiple Vitamins-Minerals (MULTIVITAMIN WITH MINERALS) tablet Take 1 tablet by mouth daily.     Omega-3 Fatty Acids (FISH OIL) 1200 MG CAPS Take 1,200 mg by mouth daily.      omeprazole (PRILOSEC) 40 MG capsule Take 40 mg by mouth daily.  5   No current facility-administered medications for this visit.    Allergies   Allergies as of 01/12/2023   (No Known Allergies)     Past Medical History   Past Medical History:  Diagnosis Date   Anemia    Chronic kidney disease    Diabetes mellitus without complication (HCC)    Hypertension        Review of Systems   General: Negative for anorexia, weight loss, fever, chills, fatigue, weakness. ENT: Negative for hoarseness, difficulty swallowing , nasal congestion. CV: Negative for chest pain, angina, palpitations, dyspnea on exertion, peripheral edema.  Respiratory: Negative for dyspnea at rest, dyspnea on exertion, cough, sputum, wheezing.  GI: See history of present illness. GU:  Negative for dysuria, hematuria, urinary incontinence, urinary frequency, nocturnal urination.  Endo: Negative for unusual weight change.     Physical Exam   BP 132/74 (BP Location: Right Arm, Patient Position: Sitting, Cuff Size:  Large)   Pulse 73   Temp 98.3 F (36.8 C) (Oral)   Ht 5' 1.5" (1.562 m)   Wt 246 lb 9.6 oz (111.9 kg)   SpO2 97%   BMI 45.84 kg/m    General: Well-nourished, well-developed in no acute distress.  Eyes: No icterus. Mouth: Oropharyngeal mucosa moist and pink  Abdomen: Bowel sounds are normal, nontender, nondistended, no hepatosplenomegaly or masses,  no abdominal bruits or hernia , no rebound or guarding.  Extremities: trace bilateral lower extremity edema. No clubbing or deformities. Neuro: Alert and oriented x 4   Skin: Warm and  dry, no jaundice.   Psych: Alert and cooperative, normal mood and affect.  Labs   Lab Results  Component Value Date   NA 139 08/27/2022   CL 107 08/27/2022   K 4.0 08/27/2022   CO2 26 08/27/2022   BUN 10 08/27/2022   CREATININE 0.87 08/27/2022   GFRNONAA >60 08/27/2022   CALCIUM 8.3 (L) 08/27/2022   PHOS 3.7 08/26/2022   ALBUMIN 3.1 (L) 08/26/2022   GLUCOSE 111 (H) 08/27/2022   Lab Results  Component Value Date   WBC 5.0 09/17/2022   HGB 10.0 (L) 09/17/2022   HCT 31.8 (L) 09/17/2022   MCV 87.4 09/17/2022   PLT 245 09/17/2022   Lab Results  Component Value Date   ALT 15 08/26/2022   AST 17 08/26/2022   ALKPHOS 65 08/26/2022   BILITOT 0.6 08/26/2022     Imaging Studies   No results found.  Assessment/Plan:   *Chronic GERD -well controlled with recent occasional heartburn off omeprazole -resume omeprazole 40mg  daily -return office visit in one year  *Epigastric burning -occurring off omeprazole, ran out two weeks ago. History of chronic dyspepsia -reinforced antireflux measures/diet -resume omeprazole 40mg  daily, call symptoms don't settle down     Leanna Battles. Melvyn Neth, MHS, PA-C [facility]

## 2023-03-23 ENCOUNTER — Ambulatory Visit: Payer: Medicare PPO | Admitting: Gastroenterology

## 2023-03-23 ENCOUNTER — Encounter: Payer: Self-pay | Admitting: Gastroenterology

## 2023-03-23 ENCOUNTER — Telehealth: Payer: Self-pay | Admitting: Gastroenterology

## 2023-03-23 VITALS — BP 124/74 | HR 76 | Temp 98.6°F | Ht 61.5 in | Wt 250.2 lb

## 2023-03-23 DIAGNOSIS — D649 Anemia, unspecified: Secondary | ICD-10-CM | POA: Insufficient documentation

## 2023-03-23 DIAGNOSIS — K219 Gastro-esophageal reflux disease without esophagitis: Secondary | ICD-10-CM | POA: Diagnosis not present

## 2023-03-23 DIAGNOSIS — D508 Other iron deficiency anemias: Secondary | ICD-10-CM

## 2023-03-23 DIAGNOSIS — D509 Iron deficiency anemia, unspecified: Secondary | ICD-10-CM

## 2023-03-23 NOTE — Patient Instructions (Addendum)
Your iron and hemoglobin are a little low but overall your hemoglobin is improved from May when you were hospitalized with bleeding. Continue to follow with Dr. Wolfgang Phoenix for anemia labs. If you have a trend downward in these numbers, let us know.  Continue omeprazole daily as needed for acid reflux. Return to the office in one year or sooner if needed.

## 2023-03-23 NOTE — Telephone Encounter (Signed)
Please let patient know that since I did not have an official referral from Dr. Wolfgang Phoenix, I didn't realize she had made this appointment per his request today. Little confusing since she already had history of anemia but he may not have been aware since he may not have access to EPIC.   As discussed at ov today, Hgb is actually better than 09/2022. But she did have normal Hgb when Dr. Wolfgang Phoenix saw her in 06/2022.   At this time I would recommend ifobt to document if any GI blood loss to explain anemia.

## 2023-03-23 NOTE — Progress Notes (Signed)
GI Office Note    Referring Provider: [facility]* Primary Care Physician:  [facility]  Primary Gastroenterologist: Hennie Duos. Marletta Lor, DO   Chief Complaint   Chief Complaint  Patient presents with   Follow-up    Follow up anemia    History of Present Illness   Stacey Mcneil is a 72 y.o. female presenting today for follow-up.  Last seen October.  She has a history of diverticular bleed with hospitalization earlier this year.  History of GERD, dysphagia.  Today: Doing well. BMs regular. No melena, brbpr.  Overall upper GI symptoms controlled on omeprazole.  No abdominal pain.  No dysphagia.  Recently seen by Dr. Marlou Porch for follow-up.  Labs on December 4 with iron of 37, iron sat 11%, TIBC 338, ferritin 160, creatinine 1.12, BUN 26, estimated GFR 53, white blood cell count 6200, hemoglobin 11.3, hematocrit normal at 36.5, MCV 85.1, platelets 231,000.   Colonoscopy 08/2022: -diverticulosis in sigmoid and descending colon  -old blood and clot throughout rectum, desc and sigmoid segments but no active bleeding lesion found. Suspected bleeding secondary to diverticula   EGD August 2022: - Multiple gastric polyps.  Hyperplastic gastric polyps. - Gastritis. Biopsied.  Slight chronic inflammation.  No H. pylori. - Normal duodenal bulb, first portion of the duodenum and second portion of the duodenum. -esophagus normal, s/p empiric dilation  Medications   Current Outpatient Medications  Medication Sig Dispense Refill   acetaminophen (TYLENOL) 650 MG CR tablet Take 650 mg by mouth every 8 (eight) hours as needed for pain.     atorvastatin (LIPITOR) 10 MG tablet Take 10 mg by mouth daily.     cetirizine (ZYRTEC) 10 MG tablet Take 10 mg by mouth daily.     cholecalciferol (VITAMIN D3) 25 MCG (1000 UNIT) tablet Take 1,000 Units by mouth daily.     Cyanocobalamin (B-12) 500 MCG TABS Take 500 mcg by mouth daily.     lisinopril  (PRINIVIL,ZESTRIL) 10 MG tablet Take 10 mg by mouth daily.   5   metFORMIN (GLUCOPHAGE) 500 MG tablet Take 500 mg by mouth daily.     metoprolol succinate (TOPROL-XL) 50 MG 24 hr tablet Take 50 mg by mouth daily.  0   Multiple Vitamins-Minerals (MULTIVITAMIN WITH MINERALS) tablet Take 1 tablet by mouth daily.     Omega-3 Fatty Acids (FISH OIL) 1200 MG CAPS Take 1,200 mg by mouth daily.      omeprazole (PRILOSEC) 40 MG capsule Take 1 capsule (40 mg total) by mouth daily before breakfast. 90 capsule 3   No current facility-administered medications for this visit.    Allergies   Allergies as of 03/23/2023   (No Known Allergies)      Review of Systems   General: Negative for anorexia, weight loss, fever, chills, fatigue, weakness. ENT: Negative for hoarseness, difficulty swallowing , nasal congestion. CV: Negative for chest pain, angina, palpitations, dyspnea on exertion, peripheral edema.  Respiratory: Negative for dyspnea at rest, dyspnea on exertion, cough, sputum, wheezing.  GI: See history of present illness. GU:  Negative for dysuria, hematuria, urinary incontinence, urinary frequency, nocturnal urination.  Endo: Negative for unusual weight change.     Physical Exam   BP 124/74   Pulse 76   Temp 98.6 F (37 C)   Ht 5' 1.5" (1.562 m)   Wt 250 lb 3.2 oz (113.5 kg)   BMI 46.51 kg/m    General: Well-nourished, well-developed in no  acute distress.  Eyes: No icterus. Mouth: Oropharyngeal mucosa moist and pink  Abdomen: Bowel sounds are normal, nontender, nondistended, no hepatosplenomegaly or masses,  no abdominal bruits or hernia , no rebound or guarding.  Rectal: not performed  Extremities: No lower extremity edema. No clubbing or deformities. Neuro: Alert and oriented x 4   Skin: Warm and dry, no jaundice.   Psych: Alert and cooperative, normal mood and affect.  Labs   Lab Results  Component Value Date   NA 139 08/27/2022   CL 107 08/27/2022   K 4.0 08/27/2022    CO2 26 08/27/2022   BUN 10 08/27/2022   CREATININE 0.87 08/27/2022   GFRNONAA >60 08/27/2022   CALCIUM 8.3 (L) 08/27/2022   PHOS 3.7 08/26/2022   ALBUMIN 3.1 (L) 08/26/2022   GLUCOSE 111 (H) 08/27/2022   Lab Results  Component Value Date   ALT 15 08/26/2022   AST 17 08/26/2022   ALKPHOS 65 08/26/2022   BILITOT 0.6 08/26/2022   Lab Results  Component Value Date   WBC 5.0 09/17/2022   HGB 10.0 (L) 09/17/2022   HCT 31.8 (L) 09/17/2022   MCV 87.4 09/17/2022   PLT 245 09/17/2022   SEE HPI Imaging Studies   No results found.  Assessment/Plan:   Chronic GERD: Doing well -Continue omeprazole 40 mg daily as needed -Return to the office in 1 year  Anemia: Normocytic anemia in the setting of chronic kidney disease.  Mildly low iron and iron sats, normal CBC.  Hemoglobin improved since June, near normal at 11.3.  Colonoscopy up-to-date. No overt GI bleeding.  Consider monitoring for now.  Patient reports having a follow-up with Dr. Wolfgang Phoenix in 4 months for additional labs. If decline in Hgb, then consider additional work up, such as updating EGD.  -stool ifobt  Leanna Battles. Melvyn Neth, MHS, PA-C [facility]

## 2023-03-24 NOTE — Telephone Encounter (Signed)
Pt was made aware and verbalized understanding. IFOBT kit was placed up front for pt to pick up.

## 2023-03-25 ENCOUNTER — Other Ambulatory Visit: Payer: Self-pay

## 2023-03-25 ENCOUNTER — Ambulatory Visit: Payer: Medicare PPO | Admitting: Gastroenterology

## 2023-03-25 DIAGNOSIS — K625 Hemorrhage of anus and rectum: Secondary | ICD-10-CM

## 2023-03-25 DIAGNOSIS — D649 Anemia, unspecified: Secondary | ICD-10-CM

## 2023-03-25 LAB — IFOBT (OCCULT BLOOD): IFOBT: NEGATIVE

## 2023-04-06 ENCOUNTER — Other Ambulatory Visit: Payer: Self-pay

## 2023-04-06 DIAGNOSIS — D649 Anemia, unspecified: Secondary | ICD-10-CM

## 2023-04-17 ENCOUNTER — Other Ambulatory Visit (HOSPITAL_COMMUNITY): Payer: Self-pay | Admitting: Nurse Practitioner

## 2023-04-17 DIAGNOSIS — Z1231 Encounter for screening mammogram for malignant neoplasm of breast: Secondary | ICD-10-CM

## 2023-05-01 ENCOUNTER — Ambulatory Visit: Payer: Medicare PPO | Attending: Cardiology | Admitting: Cardiology

## 2023-05-01 ENCOUNTER — Encounter: Payer: Self-pay | Admitting: Cardiology

## 2023-05-01 VITALS — BP 124/60 | HR 65 | Ht 61.5 in | Wt 245.5 lb

## 2023-05-01 DIAGNOSIS — I6523 Occlusion and stenosis of bilateral carotid arteries: Secondary | ICD-10-CM

## 2023-05-01 DIAGNOSIS — I350 Nonrheumatic aortic (valve) stenosis: Secondary | ICD-10-CM | POA: Diagnosis not present

## 2023-05-01 DIAGNOSIS — E782 Mixed hyperlipidemia: Secondary | ICD-10-CM

## 2023-05-01 NOTE — Progress Notes (Signed)
Clinical Summary Stacey Mcneil is a 73 y.o.female seen for the following medical problems   1.Aortic stenosis 12/2021 echo: LVEF 70-75%, no WMAs, grade II dd, moderate AS mean grad 23 AVA VTI 1.07 DI 0.42 10/2022 echo: LVEF 60-65%, moderate AS mean grad 25, AVA VTI 1.5, DI 0.49 - DOE with high levels of activity which is chronic for her   3. Carotid stenosis -12/2021 [place] bilateral 1-39% disease   4, GI bleed - admitted 08/2022 with rectal bleeding - colonscopy with deverticular bleed   5. HLD - labs followed by pcp  Past Medical History:  Diagnosis Date   Anemia    Chronic kidney disease    Diabetes mellitus without complication (HCC)    Hypertension      No Known Allergies   Current Outpatient Medications  Medication Sig Dispense Refill   acetaminophen (TYLENOL) 650 MG CR tablet Take 650 mg by mouth every 8 (eight) hours as needed for pain.     atorvastatin (LIPITOR) 10 MG tablet Take 10 mg by mouth daily.     cetirizine (ZYRTEC) 10 MG tablet Take 10 mg by mouth daily.     cholecalciferol (VITAMIN D3) 25 MCG (1000 UNIT) tablet Take 1,000 Units by mouth daily.     Cyanocobalamin (B-12) 500 MCG TABS Take 500 mcg by mouth daily.     lisinopril (PRINIVIL,ZESTRIL) 10 MG tablet Take 10 mg by mouth daily.   5   metFORMIN (GLUCOPHAGE) 500 MG tablet Take 500 mg by mouth daily.     metoprolol succinate (TOPROL-XL) 50 MG 24 hr tablet Take 50 mg by mouth daily.  0   Multiple Vitamins-Minerals (MULTIVITAMIN WITH MINERALS) tablet Take 1 tablet by mouth daily.     Omega-3 Fatty Acids (FISH OIL) 1200 MG CAPS Take 1,200 mg by mouth daily.      omeprazole (PRILOSEC) 40 MG capsule Take 1 capsule (40 mg total) by mouth daily before breakfast. 90 capsule 3   No current facility-administered medications for this visit.     Past Surgical History:  Procedure Laterality Date   BALLOON DILATION N/A 11/12/2020   Procedure: BALLOON DILATION;  Surgeon: Lanelle Bal, DO;  [facility];  Service: Endoscopy;  Laterality: N/A;   BIOPSY  11/12/2020   Procedure: BIOPSY;  Surgeon: Lanelle Bal, DO;  [facility]: [facility];  Service: Endoscopy;;   COLONOSCOPY  01/2016   [facility] healthcare: Perianal skin tag found in the perianal area, diverticulosis, nonbleeding internal hemorrhoids.  Quality of prep was good.  Next colonoscopy 10 years   COLONOSCOPY WITH PROPOFOL N/A 08/27/2022   Procedure: COLONOSCOPY WITH PROPOFOL;  Surgeon: Corbin Ade, MD;  Location: [facility];  Service: Endoscopy;  Laterality: N/A;   ESOPHAGOGASTRODUODENOSCOPY N/A 01/20/2017   Fields: Mild Schatzki ring status post dilation, gastritis (benign pathology with no H. 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)   ESOPHAGOGASTRODUODENOSCOPY (EGD) WITH PROPOFOL N/A 11/12/2020   Procedure: ESOPHAGOGASTRODUODENOSCOPY (EGD) WITH PROPOFOL;  Surgeon: Lanelle Bal, DO;  [facility];  Service: Endoscopy;  Laterality: N/A;  9:30am   SAVORY DILATION N/A 01/20/2017   Procedure: SAVORY DILATION;  Surgeon: West Bali, MD;  Location: [facility];  Service: Endoscopy;  Laterality: N/A;     No Known Allergies    Family History  Problem Relation Age of Onset   Congestive Heart Failure Father    Stroke Mother    Leukemia Brother  Heart attack Sister    Hypertension Sister    Diabetes Sister    Other Brother        double pneumonia; paralyzed   Kidney disease Brother    Diabetes Brother    Hypertension Brother    Other Brother        on dialysis   Hypertension Sister    Diabetes Sister    Hypertension Sister    Diabetes Sister    Stroke Sister    Hypertension Daughter    Hypertension Daughter    Colon cancer Neg Hx    Colon polyps Neg Hx      Social History Stacey Mcneil reports that she has never smoked. She has never been exposed to tobacco smoke. She has never used smokeless tobacco. Stacey Mcneil reports no history of alcohol  use.     Physical Examination Today's Vitals   05/01/23 1113  BP: 124/60  Pulse: 65  SpO2: 98%  Weight: 245 lb 8 oz (111.4 kg)  Height: 5' 1.5" (1.562 m)   Body mass index is 45.64 kg/m.  Gen: resting comfortably, no acute distress HEENT: no scleral icterus, pupils equal round and reactive, no palptable cervical adenopathy,  CV: RRR, 3/6 systolic murmur rusb, no jvd Resp: Clear to auscultation bilaterally GI: abdomen is soft, non-tender, non-distended, normal bowel sounds, no hepatosplenomegaly MSK: extremities are warm, no edema.  Skin: warm, no rash Neuro:  no focal deficits Psych: appropriate affect   Diagnostic Studies 12/2021 echo 1. Left ventricular ejection fraction, by estimation, is 70 to 75%. The  left ventricle has hyperdynamic function. The left ventricle has no  regional wall motion abnormalities. Left ventricular diastolic parameters  are consistent with Grade II diastolic  dysfunction (pseudonormalization). Elevated left ventricular end-diastolic  pressure. The average left ventricular global longitudinal strain is -18.7  %. The global longitudinal strain is normal.   2. Right ventricular systolic function is normal. The right ventricular  size is normal. There is normal pulmonary artery systolic pressure. The  estimated right ventricular systolic pressure is 24.3 mmHg.   3. Left atrial size was mildly dilated.   4. The mitral valve is abnormal. Mild mitral valve regurgitation.   5. The aortic valve is tricuspid. There is moderate calcification of the  aortic valve. Aortic valve regurgitation is not visualized. Moderate  aortic valve stenosis. Aortic valve mean gradient measures 23.0 mmHg.  Aortic valve Vmax measures 3.29 m/s.  Dimentionless index 0.42.   6. The inferior vena cava is normal in size with greater than 50%  respiratory variability, suggesting right atrial pressure of 3 mmHg.      12/2021 carotid US Summary:  Right Carotid: Velocities in  the right ICA are consistent with a 1-39%  stenosis.   Left Carotid: Velocities in the left ICA are consistent with a 1-39%  stenosis.   Vertebrals: Bilateral vertebral arteries demonstrate antegrade flow.  Subclavians: Normal flow hemodynamics were seen in bilateral subclavian               arteries.     10/2022 echo 1. Left ventricular ejection fraction, by estimation, is 60 to 65%. The  left ventricle has normal function. The left ventricle has no regional  wall motion abnormalities. Left ventricular diastolic parameters are  indeterminate. Elevated left ventricular  end-diastolic pressure.   2. Right ventricular systolic function is normal. The right ventricular  size is normal.   3. Left atrial size was mildly dilated.   4. The mitral valve is  abnormal. Trivial mitral valve regurgitation. No  evidence of mitral stenosis. Moderate mitral annular calcification.   5. The aortic valve is tricuspid. There is moderate calcification of the  aortic valve. Aortic valve regurgitation is not visualized. Moderate  aortic valve stenosis. Aortic valve area, by VTI measures 1.53 cm. Aortic  valve mean gradient measures 24.5  mmHg. Aortic valve Vmax measures 3.44 m/s.   Assessment and Plan  1.Aortic stenosis - moderate AS from 2024 echo - continue to monitor, repeat echo Jan 2026   2. Carotid stenosis - mild by [place], we will continue to monitor. Repeat at time of echo Jan 2026  3. HLD - request pcp labs     Antoine Poche, M.D.

## 2023-05-01 NOTE — Patient Instructions (Signed)

## 2023-05-04 ENCOUNTER — Encounter: Payer: Self-pay | Admitting: *Deleted

## 2023-05-05 ENCOUNTER — Encounter: Payer: Self-pay | Admitting: Cardiology

## 2023-05-06 ENCOUNTER — Other Ambulatory Visit: Payer: Self-pay

## 2023-05-06 DIAGNOSIS — D649 Anemia, unspecified: Secondary | ICD-10-CM

## 2023-05-14 LAB — CBC WITH DIFFERENTIAL/PLATELET
Absolute Lymphocytes: 1607 {cells}/uL (ref 850–3900)
Absolute Monocytes: 353 {cells}/uL (ref 200–950)
Basophils Absolute: 42 {cells}/uL (ref 0–200)
Basophils Relative: 0.9 %
Eosinophils Absolute: 141 {cells}/uL (ref 15–500)
Eosinophils Relative: 3 %
HCT: 36 % (ref 35.0–45.0)
Hemoglobin: 11.5 g/dL — ABNORMAL LOW (ref 11.7–15.5)
MCH: 26.9 pg — ABNORMAL LOW (ref 27.0–33.0)
MCHC: 31.9 g/dL — ABNORMAL LOW (ref 32.0–36.0)
MCV: 84.1 fL (ref 80.0–100.0)
MPV: 11.7 fL (ref 7.5–12.5)
Monocytes Relative: 7.5 %
Neutro Abs: 2557 {cells}/uL (ref 1500–7800)
Neutrophils Relative %: 54.4 %
Platelets: 226 Thousand/uL (ref 140–400)
RBC: 4.28 Million/uL (ref 3.80–5.10)
RDW: 13.4 % (ref 11.0–15.0)
Total Lymphocyte: 34.2 %
WBC: 4.7 Thousand/uL (ref 3.8–10.8)

## 2023-05-14 LAB — IRON,TIBC AND FERRITIN PANEL
%SAT: 13 % — ABNORMAL LOW (ref 16–45)
Ferritin: 133 ng/mL (ref 16–288)
Iron: 44 ug/dL — ABNORMAL LOW (ref 45–160)
TIBC: 327 ug/dL (ref 250–450)

## 2023-05-29 ENCOUNTER — Ambulatory Visit (HOSPITAL_COMMUNITY)
Admission: RE | Admit: 2023-05-29 | Discharge: 2023-05-29 | Disposition: A | Discharge status: Home / Self Care | Payer: Medicare PPO | Source: Ambulatory Visit | Attending: Nurse Practitioner | Admitting: Nurse Practitioner

## 2023-05-29 DIAGNOSIS — Z1231 Encounter for screening mammogram for malignant neoplasm of breast: Secondary | ICD-10-CM | POA: Diagnosis present

## 2023-08-05 ENCOUNTER — Inpatient Hospital Stay: Attending: Oncology | Admitting: Oncology

## 2023-08-05 ENCOUNTER — Inpatient Hospital Stay

## 2023-08-05 VITALS — BP 123/68 | HR 63 | Temp 96.6°F | Resp 18 | Ht 61.02 in | Wt 237.9 lb

## 2023-08-05 DIAGNOSIS — R12 Heartburn: Secondary | ICD-10-CM | POA: Diagnosis not present

## 2023-08-05 DIAGNOSIS — D509 Iron deficiency anemia, unspecified: Secondary | ICD-10-CM | POA: Diagnosis present

## 2023-08-05 DIAGNOSIS — Z79899 Other long term (current) drug therapy: Secondary | ICD-10-CM | POA: Insufficient documentation

## 2023-08-05 DIAGNOSIS — E1122 Type 2 diabetes mellitus with diabetic chronic kidney disease: Secondary | ICD-10-CM | POA: Diagnosis not present

## 2023-08-05 DIAGNOSIS — K5730 Diverticulosis of large intestine without perforation or abscess without bleeding: Secondary | ICD-10-CM | POA: Diagnosis not present

## 2023-08-05 DIAGNOSIS — N189 Chronic kidney disease, unspecified: Secondary | ICD-10-CM | POA: Insufficient documentation

## 2023-08-05 DIAGNOSIS — D500 Iron deficiency anemia secondary to blood loss (chronic): Secondary | ICD-10-CM

## 2023-08-05 NOTE — Patient Instructions (Signed)
 VISIT SUMMARY:  Today, you were seen for an evaluation of your low iron levels. You have a history of anemia, diabetes, and occasional heartburn. Your iron levels were found to be low during a recent visit, and you have been taking iron pills daily but recently ran out. You also reported feeling tired at times and experiencing shortness of breath when carrying loads up and down stairs. Your kidney function has improved recently, and you are active around the house with no smoking or alcohol use.  YOUR PLAN:  -ANEMIA DUE TO BLOOD LOSS: Anemia is a condition where you don't have enough healthy red blood cells to carry adequate oxygen to your body's tissues. Your anemia is likely due to gastrointestinal bleeding from colonic inflammation. We will administer two doses of IV iron infusion and you should take oral iron supplements every other day. We will schedule a follow-up in six weeks to assess your response to the treatment. Please monitor for any symptoms of gastrointestinal bleeding, such as black or bloody stools. If your iron levels continue to decline, we may refer you to a gastroenterologist.  INSTRUCTIONS:  Please follow up in six weeks to assess your response to the iron treatment. Continue taking your oral iron supplements every other day and monitor for any symptoms of gastrointestinal bleeding, such as black or bloody stools. If you notice any of these symptoms or if your iron levels continue to decline, we may need to refer you to a gastroenterologist.

## 2023-08-05 NOTE — Assessment & Plan Note (Signed)
 The most likely cause of her anemia is due to chronic blood loss.  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 Iron levels with a TSAT of 14 Last colonoscopy/endoscopy: 08/27/2022: Evidence of diverticula likely causing bleeding   -We discussed some of the risks, benefits, and alternatives of intravenous iron infusions. The patient is symptomatic from anemia and the iron level is critically low. She tolerated oral iron supplement poorly and desires to achieve higher levels of iron faster for adequate hematopoesis. Some of the side-effects to be expected including risks of infusion reactions, phlebitis, headaches, nausea and fatigue.  The patient is willing to proceed. Patient education material was dispensed. Goal is to keep TSAT level greater than 35 and resolution of anemia - Continue oral iron every other day.  Use MiraLAX for constipation - Monitor for symptoms of gastrointestinal bleeding, such as melena. - Consider referral to gastroenterology if iron levels continue to decline.  Return to clinic in 6 weeks with labs to assess response to IV iron

## 2023-08-05 NOTE — Progress Notes (Signed)
 [facility] at [facility]  HEMATOLOGY NEW VISIT  [facility]  REASON FOR REFERRAL: Iron deficiency anemia  HISTORY OF PRESENT ILLNESS: ADILENI BROOKER 73 y.o. female referred for anemia of chronic kidney disease.  Patient has a past medical history of diabetes complicated by chronic kidney disease.  She was previously noted to be anemic and received IV iron in 2022.  She is currently taking iron pills daily but has run out and has ordered more through her insurance.She denies any constipation from 1.  She experiences occasional heartburn and takes omeprazole  for this.  No blood in stools or black-colored stools.  She feels tired at times and experiences shortness of breath when carrying loads up and down stairs.   She had colonoscopy in May 2024 showing diverticula which were thought to be because of her iron deficiency anemia.  Patient since then did not have an IV iron infusion  She is a non-smoker, nonalcoholic.  Lives with her nephew in [place].  She is very functional and has no family history of colon cancer.  I have reviewed the past medical history, past surgical history, social history and family history with the patient   ALLERGIES:  has no known allergies.  MEDICATIONS:  Current Outpatient Medications  Medication Sig Dispense Refill   acetaminophen  (TYLENOL ) 650 MG CR tablet Take 650 mg by mouth every 8 (eight) hours as needed for pain.     atorvastatin  (LIPITOR) 10 MG tablet Take 10 mg by mouth daily.     cetirizine (ZYRTEC) 10 MG tablet Take 10 mg by mouth daily.     cholecalciferol  (VITAMIN D3) 25 MCG (1000 UNIT) tablet Take 1,000 Units by mouth daily.     Cyanocobalamin  (B-12) 500 MCG TABS Take 500 mcg by mouth daily.     lisinopril  (PRINIVIL ,ZESTRIL ) 10 MG tablet Take 10 mg by mouth daily.   5   metFORMIN (GLUCOPHAGE) 500 MG tablet Take 500 mg by mouth daily.     metoprolol  succinate (TOPROL -XL) 50 MG 24 hr  tablet Take 50 mg by mouth daily.  0   Multiple Vitamins-Minerals (MULTIVITAMIN WITH MINERALS) tablet Take 1 tablet by mouth daily.     Omega-3 Fatty Acids (FISH OIL) 1200 MG CAPS Take 1,200 mg by mouth daily.      omeprazole  (PRILOSEC) 40 MG capsule Take 1 capsule (40 mg total) by mouth daily before breakfast. 90 capsule 3   No current facility-administered medications for this visit.     REVIEW OF SYSTEMS:   Constitutional: Denies fevers, chills or night sweats Eyes: Denies blurriness of vision Ears, nose, mouth, throat, and face: Denies mucositis or sore throat Respiratory: Denies cough, dyspnea or wheezes Cardiovascular: Denies palpitation, chest discomfort or lower extremity swelling Gastrointestinal:  Denies nausea, heartburn or change in bowel habits Skin: Denies abnormal skin rashes Lymphatics: Denies new lymphadenopathy or easy bruising Neurological:Denies numbness, tingling or new weaknesses Behavioral/Psych: Mood is stable, no new changes  All other systems were reviewed with the patient and are negative.  PHYSICAL EXAMINATION:   Vitals:   08/05/23 1039  BP: 123/68  Pulse: 63  Resp: 18  Temp: (!) 96.6 F (35.9 C)  SpO2: 98%    GENERAL:alert, no distress and comfortable SKIN: skin color, texture, turgor are normal, no rashes or significant lesions LUNGS: clear to auscultation and percussion with normal breathing effort HEART: regular rate & rhythm and no murmurs and no lower extremity edema ABDOMEN:abdomen soft, non-tender and  normal bowel sounds Musculoskeletal:no cyanosis of digits and no clubbing  NEURO: alert & oriented x 3 with fluent speech, no focal motor/sensory deficits  LABORATORY DATA:  I have reviewed the data as listed in labs from nephrology clinic done on 07/18/2023 Iron: 43, TIBC: 312, percent sat: 14, ferritin: 199 CMP: Albumin: 3.9, creatinine: 1.02, GFR: 59 CBC: WBC: 4.5, hemoglobin: 11.3, hematocrit: 36.7, MCV: 85.9, platelets: 210  Lab  Results  Component Value Date   WBC 4.7 05/13/2023   NEUTROABS 2,557 05/13/2023   HGB 11.5 (L) 05/13/2023   HCT 36.0 05/13/2023   MCV 84.1 05/13/2023   PLT 226 05/13/2023      Chemistry      Component Value Date/Time   NA 139 08/27/2022 0452   K 4.0 08/27/2022 0452   CL 107 08/27/2022 0452   CO2 26 08/27/2022 0452   BUN 10 08/27/2022 0452   CREATININE 0.87 08/27/2022 0452      Component Value Date/Time   CALCIUM  8.3 (L) 08/27/2022 0452   ALKPHOS 65 08/26/2022 0444   AST 17 08/26/2022 0444   ALT 15 08/26/2022 0444   BILITOT 0.6 08/26/2022 0444     Colonoscopy: 08/27/22 Impression:  - Diverticulosis in the sigmoid colon and in the descending colon.  - Old blood and clot throughout the rectum, descending and sigmoid segments. No actively bleeding lesion found  - I suspect bleeding secondary to diverticula.   ASSESSMENT & PLAN:  Patient is a 73 y.o. female referred for iron deficiency anemia  IDA (iron deficiency anemia) The most likely cause of her anemia is due to chronic blood loss.  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 Iron levels with a TSAT of 14 Last colonoscopy/endoscopy: 08/27/2022: Evidence of diverticula likely causing bleeding   -We discussed some of the risks, benefits, and alternatives of intravenous iron infusions. The patient is symptomatic from anemia and the iron level is critically low. She tolerated oral iron supplement poorly and desires to achieve higher levels of iron faster for adequate hematopoesis. Some of the side-effects to be expected including risks of infusion reactions, phlebitis, headaches, nausea and fatigue.  The patient is willing to proceed. Patient education material was dispensed. Goal is to keep TSAT level greater than 35 and resolution of anemia - Continue oral iron every other day.  Use MiraLAX for constipation - Monitor for symptoms of gastrointestinal bleeding, such as melena. - Consider referral to  gastroenterology if iron levels continue to decline.  Return to clinic in 6 weeks with labs to assess response to IV iron   Orders Placed This Encounter  Procedures   Ferritin    Standing Status:   Future    Expected Date:   09/14/2023    Expiration Date:   08/04/2024   Folate    Standing Status:   Future    Expected Date:   09/14/2023    Expiration Date:   08/04/2024   Vitamin B12    Standing Status:   Future    Expected Date:   09/14/2023    Expiration Date:   08/04/2024   CBC with Differential/Platelet    Standing Status:   Future    Expected Date:   09/14/2023    Expiration Date:   08/04/2024   Comprehensive metabolic panel with GFR    Standing Status:   Future    Expected Date:   09/14/2023    Expiration Date:   08/04/2024   Iron and TIBC    Standing Status:  Future    Expected Date:   09/14/2023    Expiration Date:   08/04/2024    The total time spent in the appointment was 40 minutes encounter with patients including review of chart and various tests results, discussions about plan of care and coordination of care plan   All questions were answered. The patient knows to call the clinic with any problems, questions or concerns. No barriers to learning was detected.   Eduardo Grade, MD 4/30/20255:07 PM

## 2023-08-12 ENCOUNTER — Inpatient Hospital Stay: Attending: Hematology

## 2023-08-12 VITALS — BP 126/56 | HR 72 | Temp 97.2°F | Resp 20

## 2023-08-12 DIAGNOSIS — D500 Iron deficiency anemia secondary to blood loss (chronic): Secondary | ICD-10-CM

## 2023-08-12 DIAGNOSIS — R12 Heartburn: Secondary | ICD-10-CM | POA: Insufficient documentation

## 2023-08-12 DIAGNOSIS — E1122 Type 2 diabetes mellitus with diabetic chronic kidney disease: Secondary | ICD-10-CM | POA: Diagnosis not present

## 2023-08-12 DIAGNOSIS — Z79899 Other long term (current) drug therapy: Secondary | ICD-10-CM | POA: Diagnosis not present

## 2023-08-12 DIAGNOSIS — D509 Iron deficiency anemia, unspecified: Secondary | ICD-10-CM | POA: Diagnosis present

## 2023-08-12 DIAGNOSIS — K5730 Diverticulosis of large intestine without perforation or abscess without bleeding: Secondary | ICD-10-CM | POA: Diagnosis not present

## 2023-08-12 DIAGNOSIS — N189 Chronic kidney disease, unspecified: Secondary | ICD-10-CM | POA: Diagnosis not present

## 2023-08-12 MED ORDER — SODIUM CHLORIDE 0.9 % IV SOLN: INTRAVENOUS | Status: DC

## 2023-08-12 MED ORDER — SODIUM CHLORIDE 0.9 % IV SOLN
510.0000 mg | Freq: Once | INTRAVENOUS | Status: AC
  Administered 2023-08-12: 510 mg via INTRAVENOUS
  Filled 2023-08-12: qty 510

## 2023-08-12 NOTE — Patient Instructions (Signed)

## 2023-08-12 NOTE — Progress Notes (Signed)
 Patient tolerated iron infusion with no complaints voiced.  Peripheral IV site clean and dry with good blood return noted before and after infusion.  Band aid applied.  VSS with discharge and left in satisfactory condition with no s/s of distress noted.

## 2023-08-20 ENCOUNTER — Inpatient Hospital Stay

## 2023-08-21 ENCOUNTER — Inpatient Hospital Stay

## 2023-08-21 VITALS — BP 122/52 | HR 69 | Temp 97.0°F | Resp 17

## 2023-08-21 DIAGNOSIS — D509 Iron deficiency anemia, unspecified: Secondary | ICD-10-CM | POA: Diagnosis not present

## 2023-08-21 DIAGNOSIS — D500 Iron deficiency anemia secondary to blood loss (chronic): Secondary | ICD-10-CM

## 2023-08-21 MED ORDER — SODIUM CHLORIDE 0.9 % IV SOLN
510.0000 mg | Freq: Once | INTRAVENOUS | Status: AC
  Administered 2023-08-21: 510 mg via INTRAVENOUS
  Filled 2023-08-21: qty 510

## 2023-08-21 MED ORDER — SODIUM CHLORIDE 0.9 % IV SOLN
INTRAVENOUS | Status: DC
Start: 2023-08-21 — End: 2023-08-21

## 2023-08-21 NOTE — Progress Notes (Signed)
 Patient presents today for Feraheme  infusion. Vital signs within normal limits. Patient denies any side effects related to her last iron. MAR reviewed.   Feraheme  given today per MD orders. Tolerated infusion without adverse affects. Vital signs stable. No complaints at this time. Discharged from clinic ambulatory in stable condition. Alert and oriented x 3. F/U with [facility] as scheduled.

## 2023-08-21 NOTE — Patient Instructions (Signed)
 [facility] [place] - A DEPT OF [facility]  Discharge Instructions: Thank you for choosing [facility] to provide your oncology and hematology care.  If you have a lab appointment with the Cancer Center - please note that after April 8th, 2024, all labs will be drawn in the cancer center.  You do not have to check in or register with the main entrance as you have in the past but will complete your check-in in the cancer center.  Wear comfortable clothing and clothing appropriate for easy access to any Portacath or PICC line.   We strive to give you quality time with your provider. You may need to reschedule your appointment if you arrive late (15 or more minutes).  Arriving late affects you and other patients whose appointments are after yours.  Also, if you miss three or more appointments without notifying the office, you may be dismissed from the clinic at the provider's discretion.      For prescription refill requests, have your pharmacy contact our office and allow 72 hours for refills to be completed.    Today you received the following chemotherapy and/or immunotherapy agents Feraheme. Ferumoxytol Injection What is this medication? FERUMOXYTOL (FER ue MOX i tol) treats low levels of iron in your body (iron deficiency anemia). Iron is a mineral that plays an important role in making red blood cells, which carry oxygen from your lungs to the rest of your body. This medicine may be used for other purposes; ask your health care provider or pharmacist if you have questions. COMMON BRAND NAME(S): Feraheme What should I tell my care team before I take this medication? They need to know if you have any of these conditions: Anemia not caused by low iron levels High levels of iron in the blood Magnetic resonance imaging (MRI) test scheduled An unusual or allergic reaction to iron, other medications, foods, dyes, or preservatives Pregnant or trying to get  pregnant Breastfeeding How should I use this medication? This medication is injected into a vein. It is given by your care team in a hospital or clinic setting. Talk to your care team the use of this medication in children. Special care may be needed. Overdosage: If you think you have taken too much of this medicine contact a poison control center or emergency room at once. NOTE: This medicine is only for you. Do not share this medicine with others. What if I miss a dose? It is important not to miss your dose. Call your care team if you are unable to keep an appointment. What may interact with this medication? Other iron products This list may not describe all possible interactions. Give your health care provider a list of all the medicines, herbs, non-prescription drugs, or dietary supplements you use. Also tell them if you smoke, drink alcohol, or use illegal drugs. Some items may interact with your medicine. What should I watch for while using this medication? Visit your care team for regular checks on your progress. Tell your care team if your symptoms do not start to get better or if they get worse. You may need blood work done while you are taking this medication. You may need to eat more foods that contain iron. Talk to your care team. Foods that contain iron include whole grains or cereals, dried fruits, beans, peas, leafy green vegetables, and organ meats (liver, kidney). What side effects may I notice from receiving this medication? Side effects that  you should report to your care team as soon as possible: Allergic reactions--skin rash, itching, hives, swelling of the face, lips, tongue, or throat Low blood pressure--dizziness, feeling faint or lightheaded, blurry vision Shortness of breath Side effects that usually do not require medical attention (report to your care team if they continue or are bothersome): Flushing Headache Joint pain Muscle pain Nausea Pain, redness, or  irritation at injection site This list may not describe all possible side effects. Call your doctor for medical advice about side effects. You may report side effects to FDA at 1-800-FDA-1088. Where should I keep my medication? This medication is given in a hospital or clinic. It will not be stored at home. NOTE: This sheet is a summary. It may not cover all possible information. If you have questions about this medicine, talk to your doctor, pharmacist, or health care provider.  2024 Elsevier/Gold Standard (2022-11-12 00:00:00)      To help prevent nausea and vomiting after your treatment, we encourage you to take your nausea medication as directed.  BELOW ARE SYMPTOMS THAT SHOULD BE REPORTED IMMEDIATELY: *FEVER GREATER THAN 100.4 F (38 C) OR HIGHER *CHILLS OR SWEATING *NAUSEA AND VOMITING THAT IS NOT CONTROLLED WITH YOUR NAUSEA MEDICATION *UNUSUAL SHORTNESS OF BREATH *UNUSUAL BRUISING OR BLEEDING *URINARY PROBLEMS (pain or burning when urinating, or frequent urination) *BOWEL PROBLEMS (unusual diarrhea, constipation, pain near the anus) TENDERNESS IN MOUTH AND THROAT WITH OR WITHOUT PRESENCE OF ULCERS (sore throat, sores in mouth, or a toothache) UNUSUAL RASH, SWELLING OR PAIN  UNUSUAL VAGINAL DISCHARGE OR ITCHING   Items with * indicate a potential emergency and should be followed up as soon as possible or go to the Emergency Department if any problems should occur.  Please show the CHEMOTHERAPY ALERT CARD or IMMUNOTHERAPY ALERT CARD at check-in to the Emergency Department and triage nurse.  Should you have questions after your visit or need to cancel or reschedule your appointment, please contact [facility] [place] - A DEPT OF Eligha Bridegroom [facility] [phone]  and follow the prompts.  Office hours are 8:00 a.m. to 4:30 p.m. Monday - Friday. Please note that voicemails left after 4:00 p.m. may not be returned until the following business day.  We are closed weekends  and major holidays. You have access to a nurse at all times for urgent questions. Please call the main number to the clinic [phone] and follow the prompts.  For any non-urgent questions, you may also contact your provider using MyChart. We now offer e-Visits for anyone 67 and older to request care online for non-urgent symptoms. For details visit [email or web address].   Also download the MyChart app! Go to the app store, search "MyChart", open the app, select [place], and log in with your MyChart username and password.

## 2023-09-09 ENCOUNTER — Inpatient Hospital Stay: Attending: Oncology

## 2023-09-09 DIAGNOSIS — D509 Iron deficiency anemia, unspecified: Secondary | ICD-10-CM | POA: Insufficient documentation

## 2023-09-09 DIAGNOSIS — D500 Iron deficiency anemia secondary to blood loss (chronic): Secondary | ICD-10-CM

## 2023-09-09 LAB — CBC WITH DIFFERENTIAL/PLATELET
Abs Immature Granulocytes: 0.01 10*3/uL (ref 0.00–0.07)
Basophils Absolute: 0 10*3/uL (ref 0.0–0.1)
Basophils Relative: 1 %
Eosinophils Absolute: 0.2 10*3/uL (ref 0.0–0.5)
Eosinophils Relative: 4 %
HCT: 38 % (ref 36.0–46.0)
Hemoglobin: 12.1 g/dL (ref 12.0–15.0)
Immature Granulocytes: 0 %
Lymphocytes Relative: 33 %
Lymphs Abs: 1.4 10*3/uL (ref 0.7–4.0)
MCH: 28.3 pg (ref 26.0–34.0)
MCHC: 31.8 g/dL (ref 30.0–36.0)
MCV: 89 fL (ref 80.0–100.0)
Monocytes Absolute: 0.3 10*3/uL (ref 0.1–1.0)
Monocytes Relative: 8 %
Neutro Abs: 2.3 10*3/uL (ref 1.7–7.7)
Neutrophils Relative %: 54 %
Platelets: 186 10*3/uL (ref 150–400)
RBC: 4.27 MIL/uL (ref 3.87–5.11)
RDW: 14.8 % (ref 11.5–15.5)
WBC: 4.1 10*3/uL (ref 4.0–10.5)
nRBC: 0 % (ref 0.0–0.2)

## 2023-09-09 LAB — COMPREHENSIVE METABOLIC PANEL WITH GFR
ALT: 15 U/L (ref 0–44)
AST: 19 U/L (ref 15–41)
Albumin: 3.5 g/dL (ref 3.5–5.0)
Alkaline Phosphatase: 66 U/L (ref 38–126)
Anion gap: 6 (ref 5–15)
BUN: 15 mg/dL (ref 8–23)
CO2: 28 mmol/L (ref 22–32)
Calcium: 9.3 mg/dL (ref 8.9–10.3)
Chloride: 103 mmol/L (ref 98–111)
Creatinine, Ser: 1.16 mg/dL — ABNORMAL HIGH (ref 0.44–1.00)
GFR, Estimated: 51 mL/min — ABNORMAL LOW (ref >60)
Glucose, Bld: 95 mg/dL (ref 70–99)
Potassium: 3.6 mmol/L (ref 3.5–5.1)
Sodium: 137 mmol/L (ref 135–145)
Total Bilirubin: 0.6 mg/dL (ref 0.0–1.2)
Total Protein: 7.3 g/dL (ref 6.5–8.1)

## 2023-09-09 LAB — VITAMIN B12: Vitamin B-12: 689 pg/mL (ref 180–914)

## 2023-09-09 LAB — IRON AND TIBC
Iron: 86 ug/dL (ref 28–170)
Saturation Ratios: 27 % (ref 10.4–31.8)
TIBC: 324 ug/dL (ref 250–450)
UIBC: 238 ug/dL

## 2023-09-09 LAB — FOLATE: Folate: 32.5 ng/mL (ref >5.9)

## 2023-09-09 LAB — FERRITIN: Ferritin: 736 ng/mL — ABNORMAL HIGH (ref 11–307)

## 2023-09-16 ENCOUNTER — Inpatient Hospital Stay (HOSPITAL_BASED_OUTPATIENT_CLINIC_OR_DEPARTMENT_OTHER): Admitting: Oncology

## 2023-09-16 VITALS — BP 115/63 | HR 62 | Temp 97.6°F | Resp 20 | Wt 232.1 lb

## 2023-09-16 DIAGNOSIS — D500 Iron deficiency anemia secondary to blood loss (chronic): Secondary | ICD-10-CM | POA: Diagnosis not present

## 2023-09-16 DIAGNOSIS — D509 Iron deficiency anemia, unspecified: Secondary | ICD-10-CM | POA: Diagnosis not present

## 2023-09-16 NOTE — Assessment & Plan Note (Addendum)
 The most likely cause of her anemia is due to chronic blood loss.  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 Last colonoscopy/endoscopy: 08/27/2022: Evidence of diverticula likely causing bleeding S/p IV iron with significant improvement in iron levels and resolution of anemia   - Continue oral iron every other day for 3 more months.  Use MiraLAX for constipation - Monitor for symptoms of gastrointestinal bleeding, such as melena.  Return to clinic in 3 months for follow-up and if levels are stable at that time, can stop iron supplementation and can be discharged from clinic to follow-up with primary care

## 2023-09-16 NOTE — Patient Instructions (Signed)
 VISIT SUMMARY:  You had a follow-up appointment to check on your anemia. You reported feeling better with increased energy levels since starting iron supplements, and you have not experienced any side effects.  YOUR PLAN:  -ANEMIA: Anemia is a condition where you don't have enough healthy red blood cells to carry adequate oxygen to your body's tissues. Your anemia has resolved with iron supplementation, and your hemoglobin levels are now normal. You should continue taking your iron pills every other day. We will schedule a follow-up appointment in three months to check your hemoglobin levels again. If your hemoglobin is still normal at that time, you will be discharged from hematology.  INSTRUCTIONS:  Please continue taking your iron pills every other day and schedule a follow-up appointment in three months to check your hemoglobin levels again.

## 2023-09-16 NOTE — Progress Notes (Signed)
 [facility] at [facility]  HEMATOLOGY FOLLOW-UP VISIT  [facility]  REASON FOR FOLLOW-UP: Iron deficiency anemia  ASSESSMENT & PLAN:  Stacey Mcneil is a 73 y.o. female following for iron deficiency anemia  IDA (iron deficiency anemia) The most likely cause of her anemia is due to chronic blood loss.  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 Last colonoscopy/endoscopy: 08/27/2022: Evidence of diverticula likely causing bleeding S/p IV iron with significant improvement in iron levels and resolution of anemia   - Continue oral iron every other day for 3 more months.  Use MiraLAX for constipation - Monitor for symptoms of gastrointestinal bleeding, such as melena.  Return to clinic in 3 months for follow-up and if levels are stable at that time, can stop iron supplementation and can be discharged from clinic to follow-up with primary care   Orders Placed This Encounter  Procedures   CBC with Differential/Platelet    Standing Status:   Future    Expected Date:   12/14/2023    Expiration Date:   09/15/2024   Comprehensive metabolic panel with GFR    Standing Status:   Future    Expected Date:   12/14/2023    Expiration Date:   09/15/2024   Ferritin    Standing Status:   Future    Expected Date:   12/14/2023    Expiration Date:   09/15/2024   Iron and TIBC    Standing Status:   Future    Expected Date:   12/14/2023    Expiration Date:   09/15/2024    The total time spent in the appointment was 20 minutes encounter with patients including review of chart and various tests results, discussions about plan of care and coordination of care plan   All questions were answered. The Stacey Mcneil knows to call the clinic with any problems, questions or concerns. No barriers to learning was detected.  Eduardo Grade, MD 6/11/202510:18 AM   SUMMARY OF HEMATOLOGIC HISTORY: Iron deficiency anemia likely secondary to GI blood loss-endoscopy  showed evidence of diverticula - S/p IV iron Feraheme  510 mg X 2 doses on 08/12/2023 08/21/2023   INTERVAL HISTORY: Stacey Mcneil 73 y.o. female following for iron deficiency anemia. She has been experiencing an improvement in her symptoms since starting iron supplementation, with a notable increase in energy levels. No side effects such as constipation from the iron pills.  She is currently taking iron pills every day and has no current complaints.  I have reviewed the past medical history, past surgical history, social history and family history with the Stacey Mcneil   ALLERGIES:  has no known allergies.  MEDICATIONS:  Current Outpatient Medications  Medication Sig Dispense Refill   acetaminophen  (TYLENOL ) 650 MG CR tablet Take 650 mg by mouth every 8 (eight) hours as needed for pain.     atorvastatin  (LIPITOR) 10 MG tablet Take 10 mg by mouth daily.     cetirizine (ZYRTEC) 10 MG tablet Take 10 mg by mouth daily.     cholecalciferol  (VITAMIN D3) 25 MCG (1000 UNIT) tablet Take 1,000 Units by mouth daily.     Cyanocobalamin  (B-12) 500 MCG TABS Take 500 mcg by mouth daily.     FEROSUL 325 (65 Fe) MG tablet Take 325 mg by mouth daily.     hydrochlorothiazide (HYDRODIURIL) 25 MG tablet Take 25 mg by mouth daily.     lisinopril  (PRINIVIL ,ZESTRIL ) 10 MG tablet Take 10 mg by mouth daily.  5   metFORMIN (GLUCOPHAGE) 500 MG tablet Take 500 mg by mouth daily.     metoprolol  succinate (TOPROL -XL) 50 MG 24 hr tablet Take 50 mg by mouth daily.  0   Multiple Vitamins-Minerals (MULTIVITAMIN WITH MINERALS) tablet Take 1 tablet by mouth daily.     Omega-3 Fatty Acids (FISH OIL) 1200 MG CAPS Take 1,200 mg by mouth daily.      omeprazole  (PRILOSEC) 40 MG capsule Take 1 capsule (40 mg total) by mouth daily before breakfast. 90 capsule 3   No current facility-administered medications for this visit.     REVIEW OF SYSTEMS:   Constitutional: Denies fevers, chills or night sweats Eyes: Denies blurriness of  vision Ears, nose, mouth, throat, and face: Denies mucositis or sore throat Respiratory: Denies cough, dyspnea or wheezes Cardiovascular: Denies palpitation, chest discomfort or lower extremity swelling Gastrointestinal:  Denies nausea, heartburn or change in bowel habits Skin: Denies abnormal skin rashes Lymphatics: Denies new lymphadenopathy or easy bruising Neurological:Denies numbness, tingling or new weaknesses Behavioral/Psych: Mood is stable, no new changes  All other systems were reviewed with the Stacey Mcneil and are negative.  PHYSICAL EXAMINATION:   Vitals:   09/16/23 0951  BP: 115/63  Pulse: 62  Resp: 20  Temp: 97.6 F (36.4 C)  SpO2: 99%    GENERAL:alert, no distress and comfortable SKIN: skin color, texture, turgor are normal, no rashes or significant lesions LUNGS: clear to auscultation and percussion with normal breathing effort HEART: regular rate & rhythm and no murmurs and no lower extremity edema ABDOMEN:abdomen soft, non-tender and normal bowel sounds Musculoskeletal:no cyanosis of digits and no clubbing  NEURO: alert & oriented x 3 with fluent speech  LABORATORY DATA:  I have reviewed the data as listed  Lab Results  Component Value Date   WBC 4.1 09/09/2023   NEUTROABS 2.3 09/09/2023   HGB 12.1 09/09/2023   HCT 38.0 09/09/2023   MCV 89.0 09/09/2023   PLT 186 09/09/2023       Chemistry      Component Value Date/Time   NA 137 09/09/2023 1021   K 3.6 09/09/2023 1021   CL 103 09/09/2023 1021   CO2 28 09/09/2023 1021   BUN 15 09/09/2023 1021   CREATININE 1.16 (H) 09/09/2023 1021      Component Value Date/Time   CALCIUM  9.3 09/09/2023 1021   ALKPHOS 66 09/09/2023 1021   AST 19 09/09/2023 1021   ALT 15 09/09/2023 1021   BILITOT 0.6 09/09/2023 1021      Latest Reference Range & Units 09/09/23 10:21 09/09/23 10:22  Iron 28 - 170 ug/dL 86   UIBC ug/dL 324   TIBC 401 - 027 ug/dL 253   Saturation Ratios 10.4 - 31.8 % 27   Ferritin 11 - 307  ng/mL 736 (H)   Folate >5.9 ng/mL  32.5  Vitamin B12 180 - 914 pg/mL 689   (H): Data is abnormally high   RADIOGRAPHIC STUDIES: I have personally reviewed the radiological images as listed and agreed with the findings in the report.  None to review

## 2023-10-13 ENCOUNTER — Ambulatory Visit (INDEPENDENT_AMBULATORY_CARE_PROVIDER_SITE_OTHER): Admitting: Gastroenterology

## 2023-10-13 ENCOUNTER — Encounter: Payer: Self-pay | Admitting: Gastroenterology

## 2023-10-13 VITALS — BP 106/66 | HR 79 | Temp 98.6°F | Ht 61.5 in | Wt 234.8 lb

## 2023-10-13 DIAGNOSIS — D509 Iron deficiency anemia, unspecified: Secondary | ICD-10-CM

## 2023-10-13 DIAGNOSIS — K219 Gastro-esophageal reflux disease without esophagitis: Secondary | ICD-10-CM | POA: Diagnosis not present

## 2023-10-13 DIAGNOSIS — D649 Anemia, unspecified: Secondary | ICD-10-CM

## 2023-10-13 MED ORDER — OMEPRAZOLE 40 MG PO CPDR: 40.0000 mg | DELAYED_RELEASE_CAPSULE | Freq: Every day | ORAL | 3 refills | Status: AC

## 2023-10-13 NOTE — Progress Notes (Signed)
 GI Office Note    Referring Provider: [facility]* Primary Care Physician:  [facility]  Primary Gastroenterologist: Carlin POUR. Cindie, DO   Chief Complaint   Chief Complaint  Patient presents with   Follow-up    Follow up on anemia, GERD--pt states its doing pretty good. Good out waying the bad    History of Present Illness   Stacey Mcneil is a 73 y.o. female presenting today for follow up. H/o diverticular bleed with hospitalization 08/2022, GERD, dysphagia. History of IDA. Plans for repeat EGD if further decline in Hgb, iron stores.    Labs June 2025: Folate 32.5, ferritin 736, B12 689, hemoglobin 12.1, hematocrit 38, MCV 89.0, platelets 196,000, iron 86, iron sats 27%  She required iron infusions in 08/2023. Had been couple of years (2022) since last IV iron.   Advised to take iron every other day by hematology. She denies brbpr. No black stools. BM doing well. Heartburn well controlled. No dysphagia typically but on occasion she feels like food sits in center of chest and she has to wait until it passes. Happens when she feels she may have eaten too fast and swallowed large bolus. Last EGD in 2022, normal esophagus and empiric dilation. She wants to hold off on EGD right now.   09/2020: Hgb 12.1, iron 86, tibc 332, iron sat 26, ferritin 434  08/2022: Hgb 10.2  05/2023: Hgb 11.5, iron 44, tibc 327, ferritin 133  09/2023: Hgb 12.1, iron 86, tibc 324, iron sat 27, ferritin 736   Iron infusions: 08/2023 X 2, 06/2020, 07/2020.   Colonoscopy 08/2022: -diverticulosis in sigmoid and descending colon  -old blood and clot throughout rectum, desc and sigmoid segments but no active bleeding lesion found. Suspected bleeding secondary to diverticula   EGD August 2022: - Multiple gastric polyps.  Hyperplastic gastric polyps. - Gastritis. Biopsied.  Slight chronic inflammation.  No H. pylori. - Normal duodenal bulb, first portion of the duodenum and  second portion of the duodenum. -esophagus normal, s/p empiric dilation   Medications   Current Outpatient Medications  Medication Sig Dispense Refill   acetaminophen  (TYLENOL ) 650 MG CR tablet Take 650 mg by mouth every 8 (eight) hours as needed for pain.     atorvastatin  (LIPITOR) 10 MG tablet Take 10 mg by mouth daily.     cetirizine (ZYRTEC) 10 MG tablet Take 10 mg by mouth daily.     cholecalciferol  (VITAMIN D3) 25 MCG (1000 UNIT) tablet Take 1,000 Units by mouth daily.     FEROSUL 325 (65 Fe) MG tablet Take 325 mg by mouth daily. (Patient taking differently: Take 325 mg by mouth daily. Pt advised to take every other day)     hydrochlorothiazide (HYDRODIURIL) 25 MG tablet Take 25 mg by mouth daily.     lisinopril  (PRINIVIL ,ZESTRIL ) 10 MG tablet Take 10 mg by mouth daily.   5   metFORMIN (GLUCOPHAGE) 500 MG tablet Take 500 mg by mouth daily.     metoprolol  succinate (TOPROL -XL) 50 MG 24 hr tablet Take 50 mg by mouth daily.  0   Multiple Vitamins-Minerals (MULTIVITAMIN WITH MINERALS) tablet Take 1 tablet by mouth daily.     Omega-3 Fatty Acids (FISH OIL) 1200 MG CAPS Take 1,200 mg by mouth daily.      omeprazole  (PRILOSEC) 40 MG capsule Take 1 capsule (40 mg total) by mouth daily before breakfast. 90 capsule 3   No current facility-administered medications for this  visit.    Allergies   Allergies as of 10/13/2023   (No Known Allergies)       Review of Systems   General: Negative for anorexia, weight loss, fever, chills, fatigue, weakness. ENT: Negative for hoarseness, difficulty swallowing , nasal congestion. CV: Negative for chest pain, angina, palpitations, dyspnea on exertion, peripheral edema.  Respiratory: Negative for dyspnea at rest, dyspnea on exertion, cough, sputum, wheezing.  GI: See history of present illness. GU:  Negative for dysuria, hematuria, urinary incontinence, urinary frequency, nocturnal urination.  Endo: Negative for unusual weight change.      Physical Exam   BP 106/66   Pulse 79   Temp 98.6 F (37 C)   Ht 5' 1.5 (1.562 m)   Wt 234 lb 12.8 oz (106.5 kg)   BMI 43.65 kg/m    General: Well-nourished, well-developed in no acute distress.  Eyes: No icterus. Mouth: Oropharyngeal mucosa moist and pink   Abdomen: Bowel sounds are normal, nontender, nondistended, no hepatosplenomegaly or masses,  no abdominal bruits or hernia , no rebound or guarding.  Rectal: not performed Extremities: No lower extremity edema. No clubbing or deformities. Neuro: Alert and oriented x 4   Skin: Warm and dry, no jaundice.   Psych: Alert and cooperative, normal mood and affect.  Labs   Lab Results  Component Value Date   NA 137 09/09/2023   CL 103 09/09/2023   K 3.6 09/09/2023   CO2 28 09/09/2023   BUN 15 09/09/2023   CREATININE 1.16 (H) 09/09/2023   GFRNONAA 51 (L) 09/09/2023   CALCIUM  9.3 09/09/2023   PHOS 3.7 08/26/2022   ALBUMIN 3.5 09/09/2023   GLUCOSE 95 09/09/2023   Lab Results  Component Value Date   ALT 15 09/09/2023   AST 19 09/09/2023   ALKPHOS 66 09/09/2023   BILITOT 0.6 09/09/2023   Lab Results  Component Value Date   WBC 4.1 09/09/2023   HGB 12.1 09/09/2023   HCT 38.0 09/09/2023   MCV 89.0 09/09/2023   PLT 186 09/09/2023   Lab Results  Component Value Date   IRON 86 09/09/2023   TIBC 324 09/09/2023   FERRITIN 736 (H) 09/09/2023   Lab Results  Component Value Date   VITAMINB12 689 09/09/2023   Lab Results  Component Value Date   FOLATE 32.5 09/09/2023    Imaging Studies   No results found.  Assessment/Plan:   Chronic GERD: -doing well -continue omeprazole  40mg  daily as needed -return ov in one year.   Normocytic anemia: in setting of chronic kidney disease. Some drop in Hgb 08/2022 (last  year) when she presented with suspected diverticular bleeding. Colonoscopy completed at that time. In 12/204, stool ifobt negative. Recently received IV iron X 2. Hgb normal.   -she will continue labs with  hematology as planned, we will follow. If further decline in Hgb or additional needs in IV iron, could consider updating EGD.      Sonny RAMAN. Ezzard, MHS, PA-C [facility]

## 2023-10-13 NOTE — Patient Instructions (Signed)
 Continue omeprazole  40mg  daily before breakfast.   We will continue to follow along with your hematologist regarding your anemia. If you require additional iron infusions or have decline in your iron levels, we would offer you an upper endoscopy at that time.   Return to the office in one year or sooner if needed.

## 2023-10-14 ENCOUNTER — Ambulatory Visit: Payer: Medicare PPO | Admitting: Gastroenterology

## 2023-12-14 ENCOUNTER — Other Ambulatory Visit: Payer: Self-pay

## 2023-12-15 ENCOUNTER — Inpatient Hospital Stay: Attending: Oncology

## 2023-12-15 DIAGNOSIS — N189 Chronic kidney disease, unspecified: Secondary | ICD-10-CM | POA: Diagnosis not present

## 2023-12-15 DIAGNOSIS — E1122 Type 2 diabetes mellitus with diabetic chronic kidney disease: Secondary | ICD-10-CM | POA: Diagnosis not present

## 2023-12-15 DIAGNOSIS — R200 Anesthesia of skin: Secondary | ICD-10-CM | POA: Diagnosis not present

## 2023-12-15 DIAGNOSIS — R079 Chest pain, unspecified: Secondary | ICD-10-CM | POA: Diagnosis not present

## 2023-12-15 DIAGNOSIS — D509 Iron deficiency anemia, unspecified: Secondary | ICD-10-CM | POA: Insufficient documentation

## 2023-12-15 DIAGNOSIS — K5730 Diverticulosis of large intestine without perforation or abscess without bleeding: Secondary | ICD-10-CM | POA: Insufficient documentation

## 2023-12-15 DIAGNOSIS — D500 Iron deficiency anemia secondary to blood loss (chronic): Secondary | ICD-10-CM

## 2023-12-15 LAB — COMPREHENSIVE METABOLIC PANEL WITH GFR
ALT: 13 U/L (ref 0–44)
AST: 17 U/L (ref 15–41)
Albumin: 3.6 g/dL (ref 3.5–5.0)
Alkaline Phosphatase: 68 U/L (ref 38–126)
Anion gap: 8 (ref 5–15)
BUN: 24 mg/dL — ABNORMAL HIGH (ref 8–23)
CO2: 28 mmol/L (ref 22–32)
Calcium: 9.3 mg/dL (ref 8.9–10.3)
Chloride: 106 mmol/L (ref 98–111)
Creatinine, Ser: 1.2 mg/dL — ABNORMAL HIGH (ref 0.44–1.00)
GFR, Estimated: 48 mL/min — ABNORMAL LOW (ref >60)
Glucose, Bld: 76 mg/dL (ref 70–99)
Potassium: 3.6 mmol/L (ref 3.5–5.1)
Sodium: 142 mmol/L (ref 135–145)
Total Bilirubin: 0.7 mg/dL (ref 0.0–1.2)
Total Protein: 7.5 g/dL (ref 6.5–8.1)

## 2023-12-15 LAB — CBC WITH DIFFERENTIAL/PLATELET
Abs Immature Granulocytes: 0.02 K/uL (ref 0.00–0.07)
Basophils Absolute: 0 K/uL (ref 0.0–0.1)
Basophils Relative: 1 %
Eosinophils Absolute: 0.2 K/uL (ref 0.0–0.5)
Eosinophils Relative: 4 %
HCT: 39.2 % (ref 36.0–46.0)
Hemoglobin: 12.5 g/dL (ref 12.0–15.0)
Immature Granulocytes: 0 %
Lymphocytes Relative: 31 %
Lymphs Abs: 1.5 K/uL (ref 0.7–4.0)
MCH: 28.8 pg (ref 26.0–34.0)
MCHC: 31.9 g/dL (ref 30.0–36.0)
MCV: 90.3 fL (ref 80.0–100.0)
Monocytes Absolute: 0.4 K/uL (ref 0.1–1.0)
Monocytes Relative: 8 %
Neutro Abs: 2.7 K/uL (ref 1.7–7.7)
Neutrophils Relative %: 56 %
Platelets: 189 K/uL (ref 150–400)
RBC: 4.34 MIL/uL (ref 3.87–5.11)
RDW: 13.2 % (ref 11.5–15.5)
WBC: 4.8 K/uL (ref 4.0–10.5)
nRBC: 0 % (ref 0.0–0.2)

## 2023-12-15 LAB — IRON AND TIBC
Iron: 79 ug/dL (ref 28–170)
Saturation Ratios: 24 % (ref 10.4–31.8)
TIBC: 331 ug/dL (ref 250–450)
UIBC: 252 ug/dL

## 2023-12-15 LAB — FERRITIN: Ferritin: 435 ng/mL — ABNORMAL HIGH (ref 11–307)

## 2023-12-18 ENCOUNTER — Encounter: Payer: Self-pay | Admitting: Gastroenterology

## 2023-12-22 ENCOUNTER — Inpatient Hospital Stay: Admitting: Oncology

## 2023-12-22 VITALS — BP 133/66 | HR 60 | Temp 97.7°F | Resp 18 | Wt 231.4 lb

## 2023-12-22 DIAGNOSIS — D509 Iron deficiency anemia, unspecified: Secondary | ICD-10-CM | POA: Diagnosis not present

## 2023-12-22 DIAGNOSIS — D500 Iron deficiency anemia secondary to blood loss (chronic): Secondary | ICD-10-CM | POA: Diagnosis not present

## 2023-12-22 NOTE — Assessment & Plan Note (Addendum)
 The most likely cause of her anemia is due to chronic blood loss.  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 Last colonoscopy/endoscopy: 08/27/2022: Evidence of diverticula likely causing bleeding S/p IV iron with significant improvement in iron levels and resolution of anemia   - Continue oral iron every other day for 6 more months.  Use MiraLAX for constipation - Monitor for symptoms of gastrointestinal bleeding, such as melena.  Return to clinic in 6 months for follow-up and if levels are stable at that time, can stop iron supplementation and can be discharged from clinic to follow-up with primary care

## 2023-12-22 NOTE — Progress Notes (Signed)
 [facility] OFFICE PROGRESS NOTE  [facility]  ASSESSMENT & PLAN:    Assessment & Plan Iron deficiency anemia due to chronic blood loss The most likely cause of her anemia is due to chronic blood loss.  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 Last colonoscopy/endoscopy: 08/27/2022: Evidence of diverticula likely causing bleeding S/p IV iron with significant improvement in iron levels and resolution of anemia   - Continue oral iron every other day for 6 more months.  Use MiraLAX for constipation - Monitor for symptoms of gastrointestinal bleeding, such as melena.  Return to clinic in 6 months for follow-up and if levels are stable at that time, can stop iron supplementation and can be discharged from clinic to follow-up with primary care  No orders of the defined types were placed in this encounter.   INTERVAL HISTORY: Patient returns for follow-up for anemia.  She received 2 doses of IV Feraheme  on 07/05/2020 and 07/13/2020 and again on 08/12/2023 and 08/21/2023.  Tolerated iron infusions well.  Denies any GI bleeding, melena or hematochezia.  Appetite and energy levels are 100%.  Some chronic numbness in her hands that is more or less stable.  Has chest pain at times.    We reviewed CBC with differential, CMP, ferritin, iron panel.  SUMMARY OF HEMATOLOGIC HISTORY: Oncology History   No history exists.     CBC    Component Value Date/Time   WBC 4.8 12/15/2023 1042   RBC 4.34 12/15/2023 1042   HGB 12.5 12/15/2023 1042   HCT 39.2 12/15/2023 1042   PLT 189 12/15/2023 1042   MCV 90.3 12/15/2023 1042   MCH 28.8 12/15/2023 1042   MCHC 31.9 12/15/2023 1042   RDW 13.2 12/15/2023 1042   LYMPHSABS 1.5 12/15/2023 1042   MONOABS 0.4 12/15/2023 1042   EOSABS 0.2 12/15/2023 1042   BASOSABS 0.0 12/15/2023 1042       Latest Ref Rng & Units 12/15/2023   10:42 AM 09/09/2023   10:21 AM 08/27/2022    4:52 AM  CMP  Glucose 70 - 99  mg/dL 76  95  888   BUN 8 - 23 mg/dL 24  15  10    Creatinine 0.44 - 1.00 mg/dL 8.79  8.83  9.12   Sodium 135 - 145 mmol/L 142  137  139   Potassium 3.5 - 5.1 mmol/L 3.6  3.6  4.0   Chloride 98 - 111 mmol/L 106  103  107   CO2 22 - 32 mmol/L 28  28  26    Calcium  8.9 - 10.3 mg/dL 9.3  9.3  8.3   Total Protein 6.5 - 8.1 g/dL 7.5  7.3    Total Bilirubin 0.0 - 1.2 mg/dL 0.7  0.6    Alkaline Phos 38 - 126 U/L 68  66    AST 15 - 41 U/L 17  19    ALT 0 - 44 U/L 13  15       Lab Results  Component Value Date   FERRITIN 435 (H) 12/15/2023   VITAMINB12 689 09/09/2023    Vitals:   12/22/23 0942  BP: 133/66  Pulse: 60  Resp: 18  Temp: 97.7 F (36.5 C)  SpO2: 100%    Review of System:  Review of Systems  Constitutional:  Negative for malaise/fatigue.  Cardiovascular:  Positive for chest pain.  Neurological:  Positive for dizziness, tingling and sensory change.    Physical Exam: Physical Exam  Constitutional:      Appearance: Normal appearance.  HENT:     Head: Normocephalic and atraumatic.  Eyes:     Pupils: Pupils are equal, round, and reactive to light.  Cardiovascular:     Rate and Rhythm: Normal rate and regular rhythm.     Heart sounds: Normal heart sounds. No murmur heard. Pulmonary:     Effort: Pulmonary effort is normal.     Breath sounds: Normal breath sounds. No wheezing.  Abdominal:     General: Bowel sounds are normal. There is no distension.     Palpations: Abdomen is soft.     Tenderness: There is no abdominal tenderness.  Musculoskeletal:        General: Normal range of motion.     Cervical back: Normal range of motion.  Skin:    General: Skin is warm and dry.     Findings: No rash.  Neurological:     Mental Status: She is alert and oriented to person, place, and time.     Gait: Gait is intact.  Psychiatric:        Mood and Affect: Mood and affect normal.        Cognition and Memory: Memory normal.        Judgment: Judgment normal.      I spent  20 minutes dedicated to the care of this patient (face-to-face and non-face-to-face) on the date of the encounter to include what is described in the assessment and plan.,  Delon Hope, NP 12/22/2023 10:28 AM

## 2024-02-29 ENCOUNTER — Encounter: Payer: Self-pay | Admitting: Gastroenterology

## 2024-04-20 ENCOUNTER — Encounter: Payer: Self-pay | Admitting: Gastroenterology

## 2024-04-20 ENCOUNTER — Ambulatory Visit: Admitting: Gastroenterology

## 2024-04-20 VITALS — BP 113/70 | HR 70 | Temp 98.8°F | Ht 61.5 in | Wt 234.6 lb

## 2024-04-20 DIAGNOSIS — K219 Gastro-esophageal reflux disease without esophagitis: Secondary | ICD-10-CM

## 2024-04-20 DIAGNOSIS — D509 Iron deficiency anemia, unspecified: Secondary | ICD-10-CM | POA: Diagnosis not present

## 2024-04-20 NOTE — Progress Notes (Signed)
 "    GI Office Note    Referring Provider: [facility] Primary Care Physician:  [facility]  Primary Gastroenterologist: Carlin POUR. Cindie, DO   Chief Complaint   Chief Complaint  Patient presents with   Follow-up    Having epigastric pain after eating at times.    History of Present Illness   Stacey Mcneil is a 74 y.o. female presenting today for follow up. She has history of diverticular bleed in May 2024, GERD, dysphagia, IDA.  She is followed by hematology.  She received 2 doses of IV Feraheme  on 07/05/2020 and 07/13/2020 and again on 08/12/2023 and 08/21/2023.  Sees them again in March 2026.  Discussed the use of AI scribe software for clinical note transcription with the patient, who gave verbal consent to proceed.  History of Present Illness   Intermittent central lower chest pain just above the epigastric region. Occurs unpredictably, sometimes after eating or drinking water  but also without clear triggers. Does not feel there is exertional component. No shortness of breath, DOE, diaphoresis. Episodes last a few minutes, occur two to three times per week. Typical goes away on its on.   She denies typical heartburn when this chest pain occures but has upper discomfort after spicy foods and occasional lower abdominal pain sometimes with constipation.  She has no dysphagia, odynophagia, or food impaction. She denies hematochezia or melena. Stools are almost daily, generally soft. Stools can be dark green, which she associates with iron.  She takes omeprazole  once daily for many years but not reliably before breakfast or on an empty stomach.    She is followed by cardiology for aortic valve disease. She is due for her one year follow up and two year follow up ECHO but is not scheduled.  She has intermittent ankle swelling, worse with salt and better by morning, nocturia two to three times nightly, and no new dyspnea, fatigue,  weakness, or orthostatic symptoms.  A CBC was checked four months ago. She is followed by hematology for anemia with next labs due in 06/2024.     Prior Data     Results       Latest Ref Rng & Units 12/15/2023   10:42 AM 09/09/2023   10:21 AM 05/13/2023    3:03 PM  CBC  WBC 4.0 - 10.5 K/uL 4.8  4.1  4.7   Hemoglobin 12.0 - 15.0 g/dL 87.4  87.8  88.4   Hematocrit 36.0 - 46.0 % 39.2  38.0  36.0   Platelets 150 - 400 K/uL 189  186  226       Latest Ref Rng & Units 12/15/2023   10:42 AM 09/09/2023   10:21 AM 08/27/2022    4:52 AM  BMP  Glucose 70 - 99 mg/dL 76  95  888   BUN 8 - 23 mg/dL 24  15  10    Creatinine 0.44 - 1.00 mg/dL 8.79  8.83  9.12   Sodium 135 - 145 mmol/L 142  137  139   Potassium 3.5 - 5.1 mmol/L 3.6  3.6  4.0   Chloride 98 - 111 mmol/L 106  103  107   CO2 22 - 32 mmol/L 28  28  26    Calcium  8.9 - 10.3 mg/dL 9.3  9.3  8.3       Latest Ref Rng & Units 12/15/2023   10:42 AM 09/09/2023   10:21 AM 08/26/2022    4:44  AM  Hepatic Function  Total Protein 6.5 - 8.1 g/dL 7.5  7.3  6.5   Albumin 3.5 - 5.0 g/dL 3.6  3.5  3.1   AST 15 - 41 U/L 17  19  17    ALT 0 - 44 U/L 13  15  15    Alk Phosphatase 38 - 126 U/L 68  66  65   Total Bilirubin 0.0 - 1.2 mg/dL 0.7  0.6  0.6    Lab Results  Component Value Date   IRON 79 12/15/2023   TIBC 331 12/15/2023   FERRITIN 435 (H) 12/15/2023   Lab Results  Component Value Date   VITAMINB12 689 09/09/2023   Lab Results  Component Value Date   FOLATE 32.5 09/09/2023     Colonoscopy 08/2022: -diverticulosis in sigmoid and descending colon  -old blood and clot throughout rectum, desc and sigmoid segments but no active bleeding lesion found. Suspected bleeding secondary to diverticula -no future screening exams due to age   EGD August 2022: - Multiple gastric polyps.  Hyperplastic gastric polyps. - Gastritis. Biopsied.  Slight chronic inflammation.  No H. pylori. - Normal duodenal bulb, first portion of the duodenum and second  portion of the duodenum. -esophagus normal, s/p empiric dilation   Medications   Current Outpatient Medications  Medication Sig Dispense Refill   acetaminophen  (TYLENOL ) 650 MG CR tablet Take 650 mg by mouth every 8 (eight) hours as needed for pain.     atorvastatin  (LIPITOR) 10 MG tablet Take 10 mg by mouth daily.     azelastine (ASTELIN) 0.1 % nasal spray Place 1 spray into both nostrils 2 (two) times daily.     cetirizine (ZYRTEC) 10 MG tablet Take 10 mg by mouth daily.     cholecalciferol  (VITAMIN D3) 25 MCG (1000 UNIT) tablet Take 1,000 Units by mouth daily.     FEROSUL 325 (65 Fe) MG tablet Take 325 mg by mouth daily. (Patient taking differently: Take 325 mg by mouth daily. Pt advised to take every other day)     hydrochlorothiazide (HYDRODIURIL) 25 MG tablet Take 25 mg by mouth daily.     lisinopril  (PRINIVIL ,ZESTRIL ) 10 MG tablet Take 10 mg by mouth daily.   5   metFORMIN (GLUCOPHAGE) 500 MG tablet Take 500 mg by mouth daily.     metoprolol  succinate (TOPROL -XL) 50 MG 24 hr tablet Take 50 mg by mouth daily.  0   Multiple Vitamins-Minerals (MULTIVITAMIN WITH MINERALS) tablet Take 1 tablet by mouth daily.     Omega-3 Fatty Acids (FISH OIL) 1200 MG CAPS Take 1,200 mg by mouth daily.      omeprazole  (PRILOSEC) 40 MG capsule Take 1 capsule (40 mg total) by mouth daily before breakfast. 90 capsule 3   No current facility-administered medications for this visit.    Allergies   Allergies as of 04/20/2024   (No Known Allergies)     Past Medical History   Past Medical History:  Diagnosis Date   Anemia    Chronic kidney disease    Diabetes mellitus without complication (HCC)    Hypertension     Past Surgical History   Past Surgical History:  Procedure Laterality Date   BALLOON DILATION N/A 11/12/2020   Procedure: BALLOON DILATION;  Surgeon: Cindie Carlin POUR, DO;  [facility];  Service: Endoscopy;  Laterality: N/A;   BIOPSY  11/12/2020   Procedure: BIOPSY;   Surgeon: Cindie Carlin POUR, DO;  Location: [facility];  Service: Endoscopy;;  COLONOSCOPY  01/2016   [facility]: Perianal skin tag found in the perianal area, diverticulosis, nonbleeding internal hemorrhoids.  Quality of prep was good.  Next colonoscopy 10 years   COLONOSCOPY WITH PROPOFOL  N/A 08/27/2022   Procedure: COLONOSCOPY WITH PROPOFOL ;  Surgeon: Shaaron Lamar HERO, MD;  Location: [facility];  Service: Endoscopy;  Laterality: N/A;   ESOPHAGOGASTRODUODENOSCOPY N/A 01/20/2017   Fields: Mild Schatzki ring status post dilation, gastritis (benign pathology with no H. 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)   ESOPHAGOGASTRODUODENOSCOPY (EGD) WITH PROPOFOL  N/A 11/12/2020   Procedure: ESOPHAGOGASTRODUODENOSCOPY (EGD) WITH PROPOFOL ;  Surgeon: Cindie Carlin POUR, DO;  [facility]: [facility];  Service: Endoscopy;  Laterality: N/A;  9:30am   SAVORY DILATION N/A 01/20/2017   Procedure: SAVORY DILATION;  Surgeon: Harvey Margo CROME, MD;  [facility]: [facility];  Service: Endoscopy;  Laterality: N/A;    Past Family History   Family History  Problem Relation Age of Onset   Congestive Heart Failure Father    Stroke Mother    Leukemia Brother    Heart attack Sister    Hypertension Sister    Diabetes Sister    Other Brother        double pneumonia; paralyzed   Kidney disease Brother    Diabetes Brother    Hypertension Brother    Other Brother        on dialysis   Hypertension Sister    Diabetes Sister    Hypertension Sister    Diabetes Sister    Stroke Sister    Hypertension Daughter    Hypertension Daughter    Colon cancer Neg Hx    Colon polyps Neg Hx     Past Social History   Social History   Socioeconomic History   Marital status: Single    Spouse name: Not on file   Number of children: Not on file   Years of education: Not on file   Highest education level: Not on file  Occupational History   Not on file  Tobacco Use   Smoking status:  Never    Passive exposure: Never   Smokeless tobacco: Never  Vaping Use   Vaping status: Never Used  Substance and Sexual Activity   Alcohol use: No   Drug use: No   Sexual activity: Yes    Birth control/protection: Post-menopausal  Other Topics Concern   Not on file  Social History Narrative   ONE PARTNER.    Social Drivers of Health   Tobacco Use: Low Risk (04/20/2024)   Patient History    Smoking Tobacco Use: Never    Smokeless Tobacco Use: Never    Passive Exposure: Never  Financial Resource Strain: Not on file  Food Insecurity: No Food Insecurity (08/05/2023)   Hunger Vital Sign    Worried About Running Out of Food in the Last Year: Never true    Ran Out of Food in the Last Year: Never true  Transportation Needs: No Transportation Needs (08/05/2023)   PRAPARE - Administrator, Civil Service (Medical): No    Lack of Transportation (Non-Medical): No  Physical Activity: Not on file  Stress: Not on file  Social Connections: Not on file  Intimate Partner Violence: Not At Risk (08/05/2023)   Humiliation, Afraid, Rape, and Kick questionnaire    Fear of Current or Ex-Partner: No    Emotionally Abused: No    Physically Abused: No    Sexually Abused: No  Depression (  PHQ2-9): Low Risk (12/22/2023)   Depression (PHQ2-9)    PHQ-2 Score: 0  Alcohol Screen: Not on file  Housing: Low Risk (08/05/2023)   Housing Stability Vital Sign    Unable to Pay for Housing in the Last Year: No    Number of Times Moved in the Last Year: 0    Homeless in the Last Year: No  Utilities: Not At Risk (08/05/2023)   [employer] Utilities    Threatened with loss of utilities: No  Health Literacy: Not on file    Review of Systems   General: Negative for anorexia, weight loss, fever, chills, fatigue, weakness. ENT: Negative for hoarseness, difficulty swallowing , nasal congestion. CV: Negative for chest pain, angina, palpitations, dyspnea on exertion, +peripheral edema. See hpi Respiratory:  Negative for dyspnea at rest, dyspnea on exertion, cough, sputum, wheezing.  GI: See history of present illness. GU:  Negative for dysuria, hematuria, urinary incontinence, urinary frequency, nocturnal urination.  Endo: Negative for unusual weight change.     Physical Exam   BP 113/70   Pulse 70   Temp 98.8 F (37.1 C) (Oral)   Ht 5' 1.5 (1.562 m)   Wt 234 lb 9.6 oz (106.4 kg)   SpO2 97%   BMI 43.61 kg/m    General: Well-nourished, well-developed in no acute distress.  Eyes: No icterus. Mouth: Oropharyngeal mucosa moist and pink   Lungs: Clear to auscultation bilaterally.  Heart: Regular rate and rhythm, no murmurs rubs or gallops.  Abdomen: Bowel sounds are normal, nontender, nondistended, no hepatosplenomegaly or masses,  no abdominal bruits or hernia , no rebound or guarding.  Rectal: not performed Extremities: No lower extremity edema. No clubbing or deformities. Neuro: Alert and oriented x 4   Skin: Warm and dry, no jaundice.   Psych: Alert and cooperative, normal mood and affect.  Labs   See above  Imaging Studies   No results found.  Assessment/Plan:     Assessment & Plan Chronic gastroesophageal reflux disease Intermittent discomfort in the lower central chest above the epigastric region, likely reflux related.  - discussed switching PPI but she would like to try taking omeprazole  30 minutes before first meal of the day to see if more efficacious - recommend being more strict with anti-reflux measures - update labs, CBC, CMET, Lipase to rule screen for biliary issue, recurrent anemia - recommend she reach out to schedule her follow up with cardiology - if symptoms do not improve, we could consider EGD but would prefer updated ECHO before (she is due for 2 year follow up now) - she will let us  know how she does with medication change. If no improvement, consider switching PPI +/- EGD. -return ov in six months  IDA -update labs at this time -no overt GI  bleeding         Sonny RAMAN. Ezzard, MHS, PA-C [facility]  "

## 2024-04-20 NOTE — Patient Instructions (Signed)
 Complete labs.   As discussed, try moving your omeprazole  to 30 minutes before the first meal of the day. If you still have those pains in the lower chest after making this adjustment, let me know and we can consider switching your medication versus further work up.  Also try to allow 2 to 3 hours between eating and laying down. Eat smaller portions. Avoid spicy foods or acidic foods.   Please reach out to schedule follow up with your cardiologist.

## 2024-04-21 LAB — CBC WITH DIFFERENTIAL/PLATELET
Basophils Absolute: 0 x10E3/uL (ref 0.0–0.2)
Basos: 1 %
EOS (ABSOLUTE): 0.1 x10E3/uL (ref 0.0–0.4)
Eos: 3 %
Hematocrit: 38.9 % (ref 34.0–46.6)
Hemoglobin: 12.1 g/dL (ref 11.1–15.9)
Immature Grans (Abs): 0 x10E3/uL (ref 0.0–0.1)
Immature Granulocytes: 0 %
Lymphocytes Absolute: 1.6 x10E3/uL (ref 0.7–3.1)
Lymphs: 37 %
MCH: 28.4 pg (ref 26.6–33.0)
MCHC: 31.1 g/dL — ABNORMAL LOW (ref 31.5–35.7)
MCV: 91 fL (ref 79–97)
Monocytes Absolute: 0.3 x10E3/uL (ref 0.1–0.9)
Monocytes: 7 %
Neutrophils Absolute: 2.1 x10E3/uL (ref 1.4–7.0)
Neutrophils: 51 %
Platelets: 202 x10E3/uL (ref 150–450)
RBC: 4.26 x10E6/uL (ref 3.77–5.28)
RDW: 12.4 % (ref 11.7–15.4)
WBC: 4.2 x10E3/uL (ref 3.4–10.8)

## 2024-04-21 LAB — COMPREHENSIVE METABOLIC PANEL WITH GFR
ALT: 11 IU/L (ref 0–32)
AST: 20 IU/L (ref 0–40)
Albumin: 4 g/dL (ref 3.8–4.8)
Alkaline Phosphatase: 83 IU/L (ref 49–135)
BUN/Creatinine Ratio: 13 (ref 12–28)
BUN: 15 mg/dL (ref 8–27)
Bilirubin Total: 0.4 mg/dL (ref 0.0–1.2)
CO2: 26 mmol/L (ref 20–29)
Calcium: 9.4 mg/dL (ref 8.7–10.3)
Chloride: 104 mmol/L (ref 96–106)
Creatinine, Ser: 1.17 mg/dL — ABNORMAL HIGH (ref 0.57–1.00)
Globulin, Total: 2.9 g/dL (ref 1.5–4.5)
Glucose: 81 mg/dL (ref 70–99)
Potassium: 4.6 mmol/L (ref 3.5–5.2)
Sodium: 145 mmol/L — ABNORMAL HIGH (ref 134–144)
Total Protein: 6.9 g/dL (ref 6.0–8.5)
eGFR: 50 mL/min/1.73 — ABNORMAL LOW

## 2024-04-21 LAB — LIPASE: Lipase: 22 U/L (ref 14–85)

## 2024-04-22 ENCOUNTER — Other Ambulatory Visit (HOSPITAL_COMMUNITY): Payer: Self-pay | Admitting: Nurse Practitioner

## 2024-04-22 DIAGNOSIS — Z1231 Encounter for screening mammogram for malignant neoplasm of breast: Secondary | ICD-10-CM

## 2024-05-01 ENCOUNTER — Ambulatory Visit: Payer: Self-pay | Admitting: Gastroenterology

## 2024-05-30 ENCOUNTER — Ambulatory Visit (HOSPITAL_COMMUNITY)

## 2024-06-14 ENCOUNTER — Inpatient Hospital Stay

## 2024-06-21 ENCOUNTER — Ambulatory Visit: Admitting: Oncology

## 2024-06-21 ENCOUNTER — Inpatient Hospital Stay: Admitting: Oncology

## 2024-07-14 ENCOUNTER — Ambulatory Visit: Admitting: Cardiology

## 2024-10-18 ENCOUNTER — Ambulatory Visit: Admitting: Gastroenterology
